# Patient Record
Sex: Male | Born: 1975 | Race: White | Hispanic: No | Marital: Single | Smoking: Former smoker
Health system: Southern US, Community
[De-identification: ages and names within clinical notes are randomized; demographics above are authoritative.]

## PROBLEM LIST (undated history)

## (undated) DIAGNOSIS — F419 Anxiety disorder, unspecified: Secondary | ICD-10-CM

## (undated) DIAGNOSIS — F329 Major depressive disorder, single episode, unspecified: Secondary | ICD-10-CM

## (undated) DIAGNOSIS — K219 Gastro-esophageal reflux disease without esophagitis: Secondary | ICD-10-CM

## (undated) DIAGNOSIS — J310 Chronic rhinitis: Secondary | ICD-10-CM

## (undated) DIAGNOSIS — F32A Depression, unspecified: Secondary | ICD-10-CM

## (undated) DIAGNOSIS — S6290XA Unspecified fracture of unspecified wrist and hand, initial encounter for closed fracture: Secondary | ICD-10-CM

## (undated) DIAGNOSIS — G4700 Insomnia, unspecified: Secondary | ICD-10-CM

## (undated) DIAGNOSIS — R1010 Upper abdominal pain, unspecified: Secondary | ICD-10-CM

## (undated) HISTORY — DX: Chronic rhinitis: J31.0

## (undated) HISTORY — DX: Upper abdominal pain, unspecified: R10.10

## (undated) HISTORY — DX: Anxiety disorder, unspecified: F41.9

## (undated) HISTORY — DX: Depression, unspecified: F32.A

## (undated) HISTORY — DX: Gastro-esophageal reflux disease without esophagitis: K21.9

## (undated) HISTORY — DX: Unspecified fracture of unspecified wrist and hand, initial encounter for closed fracture: S62.90XA

## (undated) HISTORY — DX: Major depressive disorder, single episode, unspecified: F32.9

## (undated) HISTORY — DX: Insomnia, unspecified: G47.00

## (undated) HISTORY — PX: REFRACTIVE SURGERY: SHX103

---

## 2007-04-12 ENCOUNTER — Ambulatory Visit: Payer: Self-pay | Admitting: Internal Medicine

## 2007-04-12 DIAGNOSIS — M6580 Other synovitis and tenosynovitis, unspecified site: Secondary | ICD-10-CM

## 2007-04-17 ENCOUNTER — Encounter (INDEPENDENT_AMBULATORY_CARE_PROVIDER_SITE_OTHER): Payer: Self-pay | Admitting: *Deleted

## 2008-05-25 ENCOUNTER — Telehealth (INDEPENDENT_AMBULATORY_CARE_PROVIDER_SITE_OTHER): Payer: Self-pay | Admitting: *Deleted

## 2008-05-31 DIAGNOSIS — R1010 Upper abdominal pain, unspecified: Secondary | ICD-10-CM

## 2008-05-31 HISTORY — DX: Upper abdominal pain, unspecified: R10.10

## 2008-06-02 ENCOUNTER — Ambulatory Visit: Payer: Self-pay | Admitting: Internal Medicine

## 2008-06-02 DIAGNOSIS — B070 Plantar wart: Secondary | ICD-10-CM

## 2008-06-05 ENCOUNTER — Encounter (INDEPENDENT_AMBULATORY_CARE_PROVIDER_SITE_OTHER): Payer: Self-pay | Admitting: *Deleted

## 2008-06-07 ENCOUNTER — Encounter: Payer: Self-pay | Admitting: Family Medicine

## 2008-06-08 ENCOUNTER — Ambulatory Visit: Payer: Self-pay | Admitting: Family Medicine

## 2008-06-08 DIAGNOSIS — R1013 Epigastric pain: Secondary | ICD-10-CM

## 2008-06-10 ENCOUNTER — Encounter: Payer: Self-pay | Admitting: Internal Medicine

## 2008-06-15 ENCOUNTER — Telehealth (INDEPENDENT_AMBULATORY_CARE_PROVIDER_SITE_OTHER): Payer: Self-pay | Admitting: *Deleted

## 2008-07-13 ENCOUNTER — Ambulatory Visit: Payer: Self-pay | Admitting: Internal Medicine

## 2008-07-13 DIAGNOSIS — K219 Gastro-esophageal reflux disease without esophagitis: Secondary | ICD-10-CM | POA: Insufficient documentation

## 2008-07-13 DIAGNOSIS — K644 Residual hemorrhoidal skin tags: Secondary | ICD-10-CM | POA: Insufficient documentation

## 2008-07-27 ENCOUNTER — Encounter (INDEPENDENT_AMBULATORY_CARE_PROVIDER_SITE_OTHER): Payer: Self-pay | Admitting: *Deleted

## 2008-07-27 ENCOUNTER — Ambulatory Visit: Payer: Self-pay | Admitting: Internal Medicine

## 2008-08-03 ENCOUNTER — Telehealth: Payer: Self-pay | Admitting: Family Medicine

## 2009-01-13 ENCOUNTER — Ambulatory Visit: Payer: Self-pay | Admitting: Internal Medicine

## 2009-01-13 DIAGNOSIS — F341 Dysthymic disorder: Secondary | ICD-10-CM

## 2009-01-13 LAB — CONVERTED CEMR LAB
Protein, U semiquant: NEGATIVE
WBC Urine, dipstick: NEGATIVE

## 2009-02-08 ENCOUNTER — Telehealth (INDEPENDENT_AMBULATORY_CARE_PROVIDER_SITE_OTHER): Payer: Self-pay | Admitting: *Deleted

## 2009-02-08 LAB — CONVERTED CEMR LAB
ALT: 23 units/L (ref 0–53)
Albumin: 4.2 g/dL (ref 3.5–5.2)
Alkaline Phosphatase: 128 units/L — ABNORMAL HIGH (ref 39–117)
BUN: 17 mg/dL (ref 6–23)
Basophils Relative: 0.4 % (ref 0.0–3.0)
Bilirubin, Direct: 0.2 mg/dL (ref 0.0–0.3)
Calcium: 8.9 mg/dL (ref 8.4–10.5)
Chloride: 102 meq/L (ref 96–112)
Creatinine, Ser: 1 mg/dL (ref 0.4–1.5)
Eosinophils Absolute: 0 10*3/uL (ref 0.0–0.7)
Glucose, Bld: 95 mg/dL (ref 70–99)
Hemoglobin: 16.3 g/dL (ref 13.0–17.0)
LDL Cholesterol: 103 mg/dL — ABNORMAL HIGH (ref 0–99)
MCHC: 34.8 g/dL (ref 30.0–36.0)
Monocytes Absolute: 0.5 10*3/uL (ref 0.1–1.0)
Monocytes Relative: 8.3 % (ref 3.0–12.0)
Neutro Abs: 4.2 10*3/uL (ref 1.4–7.7)
Platelets: 185 10*3/uL (ref 150.0–400.0)
Potassium: 4.4 meq/L (ref 3.5–5.1)
RBC: 5.01 M/uL (ref 4.22–5.81)
Total Bilirubin: 1.1 mg/dL (ref 0.3–1.2)
VLDL: 8 mg/dL (ref 0.0–40.0)
WBC: 6.5 10*3/uL (ref 4.5–10.5)

## 2009-02-23 ENCOUNTER — Ambulatory Visit: Payer: Self-pay | Admitting: Internal Medicine

## 2009-03-05 ENCOUNTER — Telehealth (INDEPENDENT_AMBULATORY_CARE_PROVIDER_SITE_OTHER): Payer: Self-pay | Admitting: *Deleted

## 2009-03-05 LAB — CONVERTED CEMR LAB
Alkaline Phosphatase: 142 units/L — ABNORMAL HIGH (ref 39–117)
Bilirubin, Direct: 0.1 mg/dL (ref 0.0–0.3)
GGT: 29 units/L (ref 7–51)
Total Protein: 7.2 g/dL (ref 6.0–8.3)

## 2009-04-15 ENCOUNTER — Encounter: Payer: Self-pay | Admitting: Internal Medicine

## 2009-07-05 ENCOUNTER — Ambulatory Visit: Payer: Self-pay | Admitting: Internal Medicine

## 2010-01-06 ENCOUNTER — Ambulatory Visit: Payer: Self-pay | Admitting: Internal Medicine

## 2010-01-06 DIAGNOSIS — M7660 Achilles tendinitis, unspecified leg: Secondary | ICD-10-CM | POA: Insufficient documentation

## 2010-01-06 LAB — CONVERTED CEMR LAB
Ketones, urine, test strip: NEGATIVE
Urobilinogen, UA: 1
pH: 7.5

## 2010-01-12 ENCOUNTER — Encounter: Admission: RE | Admit: 2010-01-12 | Discharge: 2010-01-12 | Payer: Self-pay | Admitting: Internal Medicine

## 2010-01-13 ENCOUNTER — Telehealth (INDEPENDENT_AMBULATORY_CARE_PROVIDER_SITE_OTHER): Payer: Self-pay | Admitting: *Deleted

## 2010-02-03 ENCOUNTER — Ambulatory Visit: Payer: Self-pay | Admitting: Internal Medicine

## 2010-02-03 ENCOUNTER — Ambulatory Visit: Payer: Self-pay | Admitting: Diagnostic Radiology

## 2010-02-03 ENCOUNTER — Ambulatory Visit (HOSPITAL_BASED_OUTPATIENT_CLINIC_OR_DEPARTMENT_OTHER): Admission: RE | Admit: 2010-02-03 | Discharge: 2010-02-03 | Payer: Self-pay | Admitting: Internal Medicine

## 2010-02-03 DIAGNOSIS — R509 Fever, unspecified: Secondary | ICD-10-CM | POA: Insufficient documentation

## 2010-03-30 ENCOUNTER — Telehealth: Payer: Self-pay | Admitting: Internal Medicine

## 2010-07-04 ENCOUNTER — Emergency Department (HOSPITAL_BASED_OUTPATIENT_CLINIC_OR_DEPARTMENT_OTHER)
Admission: EM | Admit: 2010-07-04 | Discharge: 2010-07-04 | Payer: Self-pay | Source: Home / Self Care | Admitting: Emergency Medicine

## 2010-08-30 NOTE — Progress Notes (Signed)
Summary: U/S results  Phone Note Outgoing Call   Call placed by: [facility] CMA,  January 13, 2010 9:37 AM Details for Reason: advise patient ultrasound essentially negative I recommend to wait a couple of weeks, if he is not better : let me know Summary of Call: left message to call office...............Marland KitchenFelecia Deloach CMA  January 13, 2010 9:37 AM  DISCUSS WITH PATIENT .............Marland KitchenFelecia Deloach CMA  January 13, 2010 11:53 AM

## 2010-08-30 NOTE — Progress Notes (Signed)
Summary: due for chest x-ray   Phone Note Outgoing Call   Summary of Call: he is due for a repeat chest x-ray. Please arrange Jose E. Paz MD  March 30, 2010 8:51 AM   Follow-up for Phone Call        Left message for pt to call back. Army Fossa CMA  March 30, 2010 9:26 AM   Additional Follow-up for Phone Call Additional follow up Details #1::        Pt is aware.Will have done.Army Fossa CMA  March 31, 2010 10:53 AM

## 2010-08-30 NOTE — Assessment & Plan Note (Signed)
Summary: fever/congested/walk inkn   Vital Signs:  Patient profile:   35 year old male Weight:      244.50 pounds Temp:     100.8 degrees F oral Pulse rate:   86 / minute Pulse rhythm:   regular BP sitting:   126 / 82  (left arm) Cuff size:   large  Vitals Entered By: Army Fossa CMA (February 03, 2010 8:29 AM) \\   CC: Fever x 5 days, Chest Congestion Comments -Fever of 101- 102.6 - Seeing blood when coughing -Taking OTC Vitamin C and Mucinex- no relief.    History of Present Illness: persistent fever for 5 days up to 102.6 He also developed cough with green dark sputum and occasionally frank hemoptysis  ROS No nausea vomiting or diarrhea No rash No chest pain Mild headache with fever  Allergies (verified): No Known Drug Allergies  Past History:  Past Medical History: Reviewed history from 01/13/2009 and no changes required. 05-2008  upper abd pain : u/s (-) except for fatty liver changes GERD insomnia, anxiety, depression: f/u at [facility], Rx [place]   Past Surgical History: Reviewed history from 04/12/2007 and no changes required. lasik eye surgery  Social History: Reviewed history from 01/13/2009 and no changes required. retired from the [employer] currently Emergency planning/management officer in [employer] Single no children quit smokeless tobacco 01/21/2008 Alcohol use-no Drug use-no Regular exercise-yes (6-7days/wk for about 1 hour) diet has improved Caffeine - 2-5 cups coffee once daily  Physical Exam  General:  alert and well-developed.  nontoxic Head:  face symmetric Ears:  R ear normal and L ear normal.   Mouth:  no redness or discharge Neck:  no lymphadenopathy  Lungs:  normal respiratory effort, no intercostal retractions, and no accessory muscle use.  slightly decreased breath sounds at right base? Heart:  normal rate, regular rhythm, and no murmur.     Impression & Recommendations:  Problem # 1:  FEVER UNSPECIFIED (ICD-780.60)  fever and respiratory  symptoms, apparently frank hemoptysis per patient's description see instructions Will need antibiotics, will wait for the x-ray cell--[phone]  Orders: T-2 View CXR (71020TC)  Problem # 2:  addendum has pneumonia avelox 400 mg 1 po daily for one week He is going to the beach on vacation , he ineeds to  take it easy; call or see a local M.D. if symptoms get worse  chest x-ray in 6 weeks Maira Christon E. Kalecia Hartney MD  February 03, 2010 11:42 AM   spoke with pt he is aware of results, meds called in. Army Fossa CMA  February 03, 2010 11:47 AM  Complete Medication List: 1)  Protonix 40 Mg Tbec (Pantoprazole sodium) .... Take one tablet daily 2)  Avelox 400 Mg Tabs (Moxifloxacin hcl) .Marland Kitchen.. 1 po daily for one week  Patient Instructions: 1)  rest, fluids, Tylenol or Advil for fever 2)  Mucinex DM over-the-counter twice a day for cough 3)  Chest x-ray 4)  ER if symptoms severe, chest pain or difficulty breathing Prescriptions: AVELOX 400 MG TABS (MOXIFLOXACIN HCL) 1 po daily for one week  #7 x 0   Entered by:   Army Fossa CMA   Authorized by:   Nolon Rod. Damonique Brunelle MD   Signed by:   Army Fossa CMA on 02/03/2010   Method used:   Electronically to        [facility]* (retail)       [address]  [address]       [place], [place]  [address]       Ph: [phone]       Fax: [phone]   RxID:   [id number]

## 2010-08-30 NOTE — Assessment & Plan Note (Signed)
Summary: talk about issues with md//lch   Vital Signs:  Patient profile:   35 year old male Height:      74 inches Weight:      256 pounds BMI:     32.99 Pulse rate:   62 / minute BP sitting:   102 / 70  (left arm)  Vitals Entered By: Jeremy Johann CMA (January 06, 2010 2:10 PM) CC: discuss tendonitis Comments --several other concerns not specific  REVIEWED MED LIST, PATIENT AGREED DOSE AND INSTRUCTION CORRECT    History of Present Illness: 2 weeks history of right testicular discomfort, slightly sore to touch. Denies swelling or a lump No injury Has not taken any medication for this  Continue with left elbow discomfort with some radiation distally to the medial aspect of his forearm. Symptoms are the worse with hyperextension of the elbow  Continue with left Achilles tendon discomfort-numbness  Allergies (verified): No Known Drug Allergies  Past History:  Past Medical History: Reviewed history from 01/13/2009 and no changes required. 05-2008  upper abd pain : u/s (-) except for fatty liver changes GERD insomnia, anxiety, depression: f/u at [facility], Rx [place]   Past Surgical History: Reviewed history from 04/12/2007 and no changes required. lasik eye surgery  Social History: Reviewed history from 01/13/2009 and no changes required. 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 GU:  no dysuria, gross hematuria No frequency or urgency.  Physical Exam  General:  alert and well-developed.   Abdomen:  no inguinal hernias anywhere Rectal:  No external abnormalities noted. Normal sphincter tone. No rectal masses or tenderness. Genitalia:  circumcised, no hydrocele, no varicocele, no scrotal masses, no  testicular masses or atrophy, no cutaneous lesions, and no urethral discharge.  right epididymis slightly more sensitive than the left. Prostate:  Prostate gland firm and smooth, no enlargement, nodularity, tenderness, mass, asymmetry or induration. Extremities:  no lower extremity edema right elbow normal Left elbow normal to inspection and palpation, range of motion full Left Achilles tendon normal to palpation, the insertion in the heel seems to be slightly enlarged but no red or fluctuant    Impression & Recommendations:  Problem # 1:  ? of EPIDIDYMITIS, RIGHT (ICD-604.90) right testicular discomfort, right epididymis slightly more sensitive? Plan:  UA-- essentially neg  Ultrasound  Problem # 2:  TENDINITIS, ELBOW (ICD-727.09) to ortho   Orders: Orthopedic Referral (Ortho)  Problem # 3:  ACHILLES TENDINITIS (ICD-726.71)  to ortho  Orders: Orthopedic Referral (Ortho)  Complete Medication List: 1)  Protonix 40 Mg Tbec (Pantoprazole sodium) .... Take one tablet daily  Other Orders: UA Dipstick w/o Micro (manual) ([address]) Radiology Referral (Radiology)  Laboratory Results   Urine Tests    Routine Urinalysis   Color: yellow Appearance: Clear Glucose: negative   (Normal Range: Negative) Bilirubin: negative   (Normal Range: Negative) Ketone: negative   (Normal Range: Negative) Spec. Gravity: 1.015   (Normal Range: 1.003-1.035) Blood: negative   (Normal Range: Negative) pH: 7.5   (Normal Range: 5.0-8.0) Protein: trace   (Normal Range: Negative) Urobilinogen: 1.0   (Normal Range: 0-1) Nitrite: negative   (Normal Range: Negative) Leukocyte Esterace: negative   (Normal Range: Negative)

## 2010-10-11 LAB — COMPREHENSIVE METABOLIC PANEL
ALT: 31 U/L (ref 0–53)
AST: 26 U/L (ref 0–37)
Albumin: 4.6 g/dL (ref 3.5–5.2)
BUN: 23 mg/dL (ref 6–23)
CO2: 24 mEq/L (ref 19–32)
GFR calc Af Amer: >60 mL/min (ref >60)
Potassium: 4 mEq/L (ref 3.5–5.1)
Total Bilirubin: 1.3 mg/dL — ABNORMAL HIGH (ref 0.3–1.2)
Total Protein: 7.5 g/dL (ref 6.0–8.3)

## 2010-10-11 LAB — LIPASE, BLOOD: Lipase: 42 U/L (ref 23–300)

## 2010-10-11 LAB — DIFFERENTIAL
Basophils Absolute: 0 10*3/uL (ref 0.0–0.1)
Eosinophils Absolute: 0 10*3/uL (ref 0.0–0.7)
Eosinophils Relative: 1 % (ref 0–5)
Lymphs Abs: 0.4 10*3/uL — ABNORMAL LOW (ref 0.7–4.0)
Neutro Abs: 6.3 10*3/uL (ref 1.7–7.7)
Neutrophils Relative %: 84 % — ABNORMAL HIGH (ref 43–77)

## 2010-10-11 LAB — CBC
Hemoglobin: 17.4 g/dL — ABNORMAL HIGH (ref 13.0–17.0)
MCHC: 34.9 g/dL (ref 30.0–36.0)
Platelets: 201 10*3/uL (ref 150–400)
RDW: 12.8 % (ref 11.5–15.5)

## 2011-02-16 ENCOUNTER — Encounter: Payer: Self-pay | Admitting: Internal Medicine

## 2011-02-17 ENCOUNTER — Ambulatory Visit (INDEPENDENT_AMBULATORY_CARE_PROVIDER_SITE_OTHER): Payer: Managed Care, Other (non HMO) | Admitting: Internal Medicine

## 2011-02-17 ENCOUNTER — Encounter: Payer: Self-pay | Admitting: Internal Medicine

## 2011-02-17 DIAGNOSIS — M7660 Achilles tendinitis, unspecified leg: Secondary | ICD-10-CM

## 2011-02-17 DIAGNOSIS — R945 Abnormal results of liver function studies: Secondary | ICD-10-CM

## 2011-02-17 DIAGNOSIS — R7989 Other specified abnormal findings of blood chemistry: Secondary | ICD-10-CM

## 2011-02-17 DIAGNOSIS — M6580 Other synovitis and tenosynovitis, unspecified site: Secondary | ICD-10-CM

## 2011-02-17 NOTE — Assessment & Plan Note (Addendum)
Ongoing issue, refer to Dr. Pearletha Forge Local injection? Physical therapy?

## 2011-02-17 NOTE — Assessment & Plan Note (Signed)
Ongoing issue, refer to sports medicine

## 2011-02-17 NOTE — Assessment & Plan Note (Signed)
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 Recent  AP consistent with history of elevated levels. Recommend observation. Patient reassured

## 2011-02-17 NOTE — Progress Notes (Signed)
  Subjective:    Patient ID: Samuel [name], male    DOB: [birth year], 35 y.o.   MRN: [id number]  HPI Likes to go over her recent labs Also concerned about the fact that he gains weight very easy, if he's not under a strict diet . Extensive review of data from 11-2010: CBC, BMP, TSH normal Total cholesterol 195, HDL 67, LDL 99.  Past Medical History  Diagnosis Date  . Upper abdominal pain 05/2008    u/s (-) except for fatty liver changes   . GERD (gastroesophageal reflux disease)   . Insomnia     f/u at [facility], Rx [place]  . Anxiety   . Depression    Past Surgical History  Procedure Date  . Refractive surgery     Review of Systems Complains of ongoing left heel and left elbow pain    Objective:   Physical Exam Alert, oriented, in no apparent distress Elbows symmetric and essentially normal to palpation       Assessment & Plan:  Patient's counselor for more than 15 minutes. I don't see any obvious reason for him " gain weight easily", likely genetic; continued to be active and eat healthy

## 2011-02-22 ENCOUNTER — Ambulatory Visit: Payer: Managed Care, Other (non HMO) | Admitting: Family Medicine

## 2011-02-27 ENCOUNTER — Ambulatory Visit (INDEPENDENT_AMBULATORY_CARE_PROVIDER_SITE_OTHER): Payer: Managed Care, Other (non HMO) | Admitting: Family Medicine

## 2011-02-27 ENCOUNTER — Encounter: Payer: Self-pay | Admitting: Family Medicine

## 2011-02-27 DIAGNOSIS — M7660 Achilles tendinitis, unspecified leg: Secondary | ICD-10-CM

## 2011-02-27 DIAGNOSIS — M6580 Other synovitis and tenosynovitis, unspecified site: Secondary | ICD-10-CM

## 2011-02-27 NOTE — Progress Notes (Signed)
Subjective:    Patient ID: Samuel [name], male    DOB: [birth year], 35 y.o.   MRN: [id number] PCP: Dr. Drue Novel  HPI 35 yo M here for left achilles pain, left elbow pain  [name]. At the time it started he was running 3-5 days a week. Developed pain after a run at one time, took a week off - went back to running and had burning in left achilles at insertion. Has dealt with this issue ever since then. Saw an orthopedist and was advised to stretch the area which he has [name]. No limping and ok with regular activities - gets worse after running. Has not run in 2 months now [employer], icing as well. Has not tried any inserts or braces.  2. Left elbow pain Patient lifts weights 4 times a week. Reports over past several months has developed what feels like a knot on outside aspect of posterior elbow. Gets pinching in this location with full extension. No acute injury. He is ambidextrous. Has not tried PT.  Past Medical History  Diagnosis Date  . Upper abdominal pain 05/2008    u/s (-) except for fatty liver changes   . GERD (gastroesophageal reflux disease)   . Insomnia     f/u at [facility], Rx [place]  . Anxiety   . Depression     Current Outpatient Prescriptions on File Prior to Visit  Medication Sig Dispense Refill  . pantoprazole (PROTONIX) 40 MG tablet Take 40 mg by mouth daily.        Marland Kitchen zolpidem (AMBIEN) 5 MG tablet Take 2.5 mg by mouth at bedtime as needed.          Past Surgical History  Procedure Date  . Refractive surgery     No Known Allergies  History   Social History  . Marital Status: Single    Spouse Name: N/A    Number of Children: 0  . Years of Education: N/A   Occupational History  . Retired from [employer]   . Emergency planning/management officer in [employer] [place]    Social History Main Topics  . Smoking status: Former Games developer  . Smokeless tobacco: Former Neurosurgeon    Quit date:  02/27/2008  . Alcohol Use: No  . Drug Use: No  . Sexually Active: Not on file   Other Topics Concern  . Not on file   Social History Narrative   Regular exercise- yes (6-7 days/wk for about 1 hour)Diet- has improved Caffeine- 2-5 cups coffee once daily    Family History  Problem Relation Age of Onset  . Diabetes      GM  . Heart attack      GF  . Prostate cancer Neg Hx   . Colon cancer Neg Hx   . Sudden death Neg Hx   . Hyperlipidemia Neg Hx   . Colon polyps Father     in his mid 45s   . Diabetes Maternal Grandmother   . Hypertension Maternal Grandmother   . Heart attack Maternal Grandfather     BP 124/77  Pulse 65  Temp(Src) 98 F (36.7 C) (Oral)  Ht 6' (1.829 m)  Wt 255 lb 6.4 oz (115.849 kg)  BMI 34.64 kg/m2  Review of Systems See HPI above.    Objective:   Physical Exam Gen: NAD  L foot/ankle: haglunds deformity left heel.  No ecchymoses, other deformity. FROM with  pain achilles insertion with full passive dorsiflexion. TTP insertion.  No TTP within Achilles body or calf.  No other TTP about foot/ankle. Negative ant drawer and talar tilt.   Negative syndesmotic compression. Thompsons test negative. Negative syndesmotic compression. NV intact distally.  No leg length inequality.  L elbow: No gross deformity, swelling, ecchymoses. FROM but pain at full extension. TTP lateral triceps tendon at insertion reproducing his pain. Collateral ligaments intact. Strength 5/5 with elbow flexion and extension. Negative tinels at elbow.  R elbow: FROM without pain, swelling, weakness, instability.    Assessment & Plan:  1. Left achilles tendinopathy - shown home exercises to start at least once daily.  Icing, avoid uneven ground.  Heel lifts.  Consider comforthotics with heel lifts, physical therapy, nitro patches if not improving.  2. Left triceps tendinopathy - start PT to learn home exercises, stretches.  Pinching lateral aspect of triceps insertion may  be related to spur pinching at this location.  Will try to treat conservatively.  If not improving, consider x-rays, possibility of MRI/referral for debridement of this area that's causing mechanical symptoms.

## 2011-02-27 NOTE — Patient Instructions (Signed)
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 Ice the area 15 minutes at a time 3-4 times a day. Lowering/raise on a step exercise 3 x 15 once or twice a day - if too difficult, can start on the edge of a book 3 sets of 6 and advance. Avoid uneven ground, hills as much as possible. Heel lifts help unload the area of pain from stretching out fully during the day. Over-the-counter orthotics may be helpful (we can make custom ones here as well). Formal physical therapy will show you additional exercises to help improve this - make sure you do these every day. Ok to run as long as not limping and pain is less than a 3 on a scale of 1-10 - otherwise cross train with biking, swimming.  The area of pain in your left elbow is the distal triceps tendon. Physical therapy is the mainstay of treatment for this as well - do these every day. You may have to back down on heavy weights while rehabbing this. Ice the area 15 minutes at a time 3-4 times a day. If not improving after 6 weeks we can consider nitro patches to both of these areas (shown to help with chronic tendinopathies). Surgery is an option but we will try to avoid this if at all possible.

## 2011-02-28 ENCOUNTER — Encounter: Payer: Self-pay | Admitting: Family Medicine

## 2011-02-28 NOTE — Assessment & Plan Note (Signed)
Left achilles tendinopathy - shown home exercises to start at least once daily.  Icing, avoid uneven ground.  Heel lifts.  Consider comforthotics with heel lifts, physical therapy, nitro patches if not improving.

## 2011-02-28 NOTE — Assessment & Plan Note (Signed)
Left triceps tendinopathy - start PT to learn home exercises, stretches.  Pinching lateral aspect of triceps insertion may be related to spur pinching at this location.  Will try to treat conservatively.  If not improving, consider x-rays, possibility of MRI/referral for debridement of this area that's causing mechanical symptoms.

## 2011-03-06 ENCOUNTER — Ambulatory Visit: Payer: Managed Care, Other (non HMO) | Attending: Family Medicine | Admitting: Physical Therapy

## 2011-03-15 ENCOUNTER — Emergency Department (HOSPITAL_BASED_OUTPATIENT_CLINIC_OR_DEPARTMENT_OTHER)
Admission: EM | Admit: 2011-03-15 | Discharge: 2011-03-15 | Disposition: A | Discharge status: Home / Self Care | Payer: Managed Care, Other (non HMO) | Attending: Emergency Medicine | Admitting: Emergency Medicine

## 2011-03-15 ENCOUNTER — Encounter (HOSPITAL_BASED_OUTPATIENT_CLINIC_OR_DEPARTMENT_OTHER): Payer: Self-pay | Admitting: *Deleted

## 2011-03-15 DIAGNOSIS — S01501A Unspecified open wound of lip, initial encounter: Secondary | ICD-10-CM | POA: Insufficient documentation

## 2011-03-15 DIAGNOSIS — S0181XA Laceration without foreign body of other part of head, initial encounter: Secondary | ICD-10-CM

## 2011-03-15 DIAGNOSIS — K219 Gastro-esophageal reflux disease without esophagitis: Secondary | ICD-10-CM | POA: Insufficient documentation

## 2011-03-15 MED ORDER — TETANUS-DIPHTH-ACELL PERTUSSIS 5-2.5-18.5 LF-MCG/0.5 IM SUSP
0.5000 mL | Freq: Once | INTRAMUSCULAR | Status: AC
  Administered 2011-03-15: 0.5 mL via INTRAMUSCULAR
  Filled 2011-03-15: qty 0.5

## 2011-03-15 MED ORDER — LIDOCAINE-EPINEPHRINE 2 %-1:100000 IJ SOLN
INTRAMUSCULAR | Status: AC
  Administered 2011-03-15: 20 mL
  Filled 2011-03-15: qty 1

## 2011-03-15 MED ORDER — LIDOCAINE-EPINEPHRINE 2 %-1:100000 IJ SOLN
20.0000 mL | Freq: Once | INTRAMUSCULAR | Status: AC
  Administered 2011-03-15: 20 mL

## 2011-03-15 NOTE — ED Notes (Signed)
Pt removed his own retainer and was given denture cup to place it in.
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
## 2011-05-17 ENCOUNTER — Ambulatory Visit: Payer: Managed Care, Other (non HMO) | Admitting: Family Medicine

## 2011-05-17 ENCOUNTER — Ambulatory Visit (INDEPENDENT_AMBULATORY_CARE_PROVIDER_SITE_OTHER): Payer: Managed Care, Other (non HMO) | Admitting: Internal Medicine

## 2011-05-17 ENCOUNTER — Encounter: Payer: Self-pay | Admitting: Internal Medicine

## 2011-05-17 VITALS — BP 118/70 | HR 75 | Temp 97.9°F | Resp 16 | Wt 252.0 lb

## 2011-05-17 DIAGNOSIS — J310 Chronic rhinitis: Secondary | ICD-10-CM

## 2011-05-17 HISTORY — DX: Chronic rhinitis: J31.0

## 2011-05-17 MED ORDER — AMOXICILLIN 500 MG PO CAPS: 1000.0000 mg | ORAL_CAPSULE | Freq: Two times a day (BID) | ORAL | Status: AC

## 2011-05-17 MED ORDER — AZELASTINE-FLUTICASONE 137-50 MCG/ACT NA SUSP: 2.0000 | Freq: Every day | NASAL | Status: DC

## 2011-05-17 NOTE — Progress Notes (Signed)
  Subjective:    Patient ID: Samuel [name], male    DOB: [birth year], 35 y.o.   MRN: [id number]  HPI Nasal congestion for several months without much nasal discharge. 2 months ago he started to take over-the-counter Afrin daily, initially helped but now is not. He also takes Claritin-D over-the-counter.  Past Medical History  Diagnosis Date  . Upper abdominal pain 05/2008    u/s (-) except for fatty liver changes   . GERD (gastroesophageal reflux disease)   . Insomnia     f/u at [facility], Rx [place]  . Anxiety   . Depression    Past Surgical History  Procedure Date  . Refractive surgery      Review of Systems Very mild nasal discharge from time to time, no sore throat, some postnasal dripping. No cough No itchy eyes but slightly itchy nose and sneezing from time to time      Objective:   Physical Exam  Constitutional: He appears well-developed and well-nourished.  HENT:  Head: Normocephalic and atraumatic.  Right Ear: External ear normal.  Left Ear: External ear normal.       Face is symmetric, slightly tender and both frontal sinuses, not tender at the maxillary sinuses. Nose definitely congested, turbinates erythematous and slt swollen L>R w/o obvious polyps. No discharge noted  Cardiovascular: Normal rate, regular rhythm and normal heart sounds.   No murmur heard. Pulmonary/Chest: Effort normal and breath sounds normal. No respiratory distress. He has no wheezes. He has no rales.          Assessment & Plan:

## 2011-05-17 NOTE — Assessment & Plan Note (Addendum)
Symptoms consistent with rhinitis, likely allergic complicated by rebound from Afrin. Also possible infection in light of chronic congestion and tenderness at the frontal sinuses. Plan: Discontinue Afrin Nasal steroids and antihistaminics Amoxicillin Discontinue the oral decongestants. See instructions Patient will call if not better in a few weeks, ENT referral?

## 2011-05-17 NOTE — Patient Instructions (Signed)
claritin without the "D" once a day Call if no better in 1 month

## 2011-12-01 ENCOUNTER — Telehealth: Payer: Self-pay | Admitting: Internal Medicine

## 2011-12-01 MED ORDER — SCOPOLAMINE 1 MG/3DAYS TD PT72: 1.0000 | MEDICATED_PATCH | TRANSDERMAL | Status: DC

## 2011-12-01 NOTE — Telephone Encounter (Signed)
Discussed with pt

## 2011-12-01 NOTE — Telephone Encounter (Signed)
Please advise 

## 2011-12-01 NOTE — Telephone Encounter (Signed)
Prescription sent to the pharmacy, strongly encourage him to try them out before he leaves town to be sure he doesn't have side effects from it

## 2011-12-01 NOTE — Telephone Encounter (Signed)
Patient states he is going out of town with his father for some deep sea excursions and would like to get some patches called into [facility] for motion sickness. Can call patient at [phone]

## 2012-04-05 ENCOUNTER — Telehealth: Payer: Self-pay | Admitting: *Deleted

## 2012-04-05 NOTE — Telephone Encounter (Signed)
Left message to call office

## 2012-04-05 NOTE — Telephone Encounter (Signed)
[facility] Triage Call Report Triage Record Num: [id number] Operator: Cornell Barman Patient Name: Samuel [name] Call Date & Time: 04/04/2012 6:01:44PM Patient Phone: [phone] PCP: Nolon Rod. Paz Patient Gender: Male PCP Fax : Patient DOB: 1976-05-05 Practice Name: [facility] Reason for Call: Caller: Malcom/Patient; PCP: Willow Ora; CB#: [phone]; Call regarding Chigger bites; Onset 03/29/12. While walking [place]. He states they are both legs from mid thigh down. He estimates approx 40-50 bites. no swelling Afebrile, intense itching. He has treated with OTC after bite medications. i.e. chigger X, Chig-guard, Hydrogen Peroxide. Emergent s/sx ruled out per Bites/stings -Insect and spider Protocol with exception to Itchy bites. Caller requesting medication. He is a Emergency planning/management officer must wear socks/boots. He uses [facility] in [place] . Home care reviewed advised Benadryl 50mg  po every 6 hours while not working. Understanding expressed. Advised i would forward MEDICATION REQUEST TO THE OFFICE FOR BITES AND ITCHING. Encouraged to call back for questions, changes or concerns. Protocol(s) Used: Bites and Stings - Insects or Spiders Recommended Outcome per Protocol: Provide Home/Self Care Reason for Outcome: Itchy bites Care Advice: Topical corticosteroid cream/gel may be used as directed on label or by pharmacist. DO NOT apply a dressing to the area. ~ Apply cloth-covered ice pack or a cool compress to the area for no more than 20 minutes 4-8 times a day while awake to reduce pain and swelling. ~ ~ SYMPTOM / CONDITION MANAGEMENT ITCHING RELIEF: - Avoid scratching; may cause further irritation and secondary infection - Take cool showers or baths to relieve itching - If cool water alone does not relieve itching, try adding 1/2 to 1 cup baking soda or colloidal oatmeal (Aveeno) to bath water - Follow with application of a bland lotion such as  calamine (do not apply to the eyes or genitals)

## 2012-04-05 NOTE — Telephone Encounter (Signed)
Discuss with patient  

## 2012-04-05 NOTE — Telephone Encounter (Signed)
To use hydrocortisone cream 1% OTC, benadryl  as needed; office visit if not improving

## 2012-07-02 ENCOUNTER — Encounter: Payer: Self-pay | Admitting: Internal Medicine

## 2012-07-02 ENCOUNTER — Ambulatory Visit (INDEPENDENT_AMBULATORY_CARE_PROVIDER_SITE_OTHER): Payer: Managed Care, Other (non HMO) | Admitting: Internal Medicine

## 2012-07-02 VITALS — BP 136/86 | HR 63 | Temp 98.1°F | Wt 276.0 lb

## 2012-07-02 DIAGNOSIS — Z0000 Encounter for general adult medical examination without abnormal findings: Secondary | ICD-10-CM | POA: Insufficient documentation

## 2012-07-02 DIAGNOSIS — J310 Chronic rhinitis: Secondary | ICD-10-CM

## 2012-07-02 MED ORDER — AMOXICILLIN 500 MG PO CAPS: 1000.0000 mg | ORAL_CAPSULE | Freq: Two times a day (BID) | ORAL | Status: DC

## 2012-07-02 MED ORDER — FLUTICASONE PROPIONATE 50 MCG/ACT NA SUSP: 2.0000 | Freq: Every day | NASAL | Status: DC

## 2012-07-02 MED ORDER — LORATADINE 10 MG PO TABS: 10.0000 mg | ORAL_TABLET | Freq: Every day | ORAL | Status: DC

## 2012-07-02 NOTE — Assessment & Plan Note (Signed)
Acute on chronic nasal congestion, likely allergis with a URI Used to take dynmista has not used it in a while. Plan: Flonase for several weeks, Claritin daily. Antibiotics for the acute infex If not improving in the next few weeks, patient will call for ENT referral, nasal polyps?

## 2012-07-02 NOTE — Patient Instructions (Addendum)
Rest, fluids , tylenol For cough, take Mucinex DM twice a day as needed  Take the antibiotic, amoxicillin, for one week. Call if no better in few days Call anytime if the symptoms are severe ---- Start using Flonase every day once a day, also Claritin 10 mg once a day. If the chronic blockage of the nose is not better, call me in few weeks for ENT referral

## 2012-07-02 NOTE — Progress Notes (Signed)
  Subjective:    Patient ID: Samuel [name], male    DOB: 11-Sep-1975, 36 y.o.   MRN: [id number]  HPI Developed nose congestion, nasal discharge which is a slightly colored and even some blood in the nasal discharge last weekend. In the last 48 hours has noted cough, chest congestion and some sputum production. Also, for the last few months his nose feels stuffy even if he doesn't have any acute symptoms.   Past Medical History  Diagnosis Date  . Upper abdominal pain 05/2008    u/s (-) except for fatty liver changes   . GERD (gastroesophageal reflux disease)   . Insomnia     f/u at [facility], Rx [place]  . Anxiety and depression   . Hand fracture     Left 2013   . Chronic rhinitis 05/17/2011   Past Surgical History  Procedure Date  . Refractive surgery     Review of Systems No fever chills No nausea, vomiting, diarrhea or myalgias Denies any sneezing, itchy eyes or itchy nose    Objective:   Physical Exam  General -- alert, well-developed Neck --no LADs HEENT -- TMs normal, throat w/o redness, face symmetric and not tender to palpation,  nose with edematous turbinates, mild amount of discharge noted Lungs -- normal respiratory effort, no intercostal retractions, no accessory muscle use, and normal breath sounds.   Heart-- normal rate, regular rhythm, no murmur, and no gallop.    Neurologic-- alert & oriented X3 and strength normal in all extremities. Psych-- Cognition and judgment appear intact. Alert and cooperative with normal attention span and concentration.  not anxious appearing and not depressed appearing.       Assessment & Plan:

## 2012-07-02 NOTE — Assessment & Plan Note (Signed)
Gets physicals at the [facility]

## 2012-07-22 ENCOUNTER — Telehealth: Payer: Self-pay | Admitting: Internal Medicine

## 2012-07-22 NOTE — Telephone Encounter (Signed)
Patient is no better, is requesting new Rx. Please contact patient.

## 2012-07-22 NOTE — Telephone Encounter (Signed)
lmovm for pt to return call.  

## 2012-12-09 ENCOUNTER — Ambulatory Visit (INDEPENDENT_AMBULATORY_CARE_PROVIDER_SITE_OTHER): Payer: Managed Care, Other (non HMO) | Admitting: Family Medicine

## 2012-12-09 ENCOUNTER — Encounter: Payer: Self-pay | Admitting: Family Medicine

## 2012-12-09 VITALS — BP 120/90 | HR 73 | Temp 98.5°F | Wt 267.2 lb

## 2012-12-09 DIAGNOSIS — J019 Acute sinusitis, unspecified: Secondary | ICD-10-CM

## 2012-12-09 DIAGNOSIS — J329 Chronic sinusitis, unspecified: Secondary | ICD-10-CM

## 2012-12-09 MED ORDER — MOMETASONE FUROATE 50 MCG/ACT NA SUSP: 2.0000 | Freq: Every day | NASAL | Status: DC

## 2012-12-09 MED ORDER — CEFUROXIME AXETIL 500 MG PO TABS: 500.0000 mg | ORAL_TABLET | Freq: Two times a day (BID) | ORAL | Status: DC

## 2012-12-09 NOTE — Progress Notes (Signed)
  Subjective:     Samuel [name] is a 37 y.o. male who presents for evaluation of sinus pain. Symptoms include: congestion, cough, facial pain, nasal congestion, post nasal drip, purulent rhinorrhea and sinus pressure. Onset of symptoms was 1 week ago. Symptoms have been gradually worsening since that time. Past history is significant for no history of pneumonia or bronchitis. Patient is a non-smoker.  The following portions of the patient's history were reviewed and updated as appropriate: allergies, current medications, past family history, past medical history, past social history, past surgical history and problem list.  Review of Systems Pertinent items are noted in HPI.   Objective:    BP 120/90  Pulse 73  Temp(Src) 98.5 F (36.9 C) (Oral)  Wt 267 lb 3.2 oz (121.201 kg)  BMI 36.23 kg/m2  SpO2 95% General appearance: alert, cooperative, appears stated age and no distress Ears: normal TM's and external ear canals both ears Nose: green discharge, moderate congestion, turbinates red, swollen, sinus tenderness bilateral Throat: lips, mucosa, and tongue normal; teeth and gums normal Neck: mild anterior cervical adenopathy, no adenopathy, no carotid bruit, no JVD, supple, symmetrical, trachea midline and thyroid not enlarged, symmetric, no tenderness/mass/nodules Lungs: clear to auscultation bilaterally Heart: S1, S2 normal    Assessment:    Acute bacterial sinusitis.    Plan:    Neti pot recommended. Instructions given. Nasal steroids per medication orders. Antihistamines per medication orders. Ceftin per medication orders. f/u pcp prn

## 2012-12-09 NOTE — Patient Instructions (Signed)

## 2012-12-09 NOTE — Addendum Note (Signed)
Addended by: Arnette Norris on: 12/09/2012 04:43 PM   Modules accepted: Orders

## 2012-12-09 NOTE — Addendum Note (Signed)
Addended by: Lelon Perla on: 12/09/2012 04:17 PM   Modules accepted: Level of Service

## 2012-12-11 ENCOUNTER — Telehealth: Payer: Self-pay | Admitting: Internal Medicine

## 2012-12-11 ENCOUNTER — Ambulatory Visit (INDEPENDENT_AMBULATORY_CARE_PROVIDER_SITE_OTHER): Payer: Managed Care, Other (non HMO) | Admitting: Internal Medicine

## 2012-12-11 ENCOUNTER — Encounter: Payer: Self-pay | Admitting: *Deleted

## 2012-12-11 VITALS — BP 122/84 | HR 73 | Temp 98.2°F | Wt 266.0 lb

## 2012-12-11 DIAGNOSIS — J019 Acute sinusitis, unspecified: Secondary | ICD-10-CM

## 2012-12-11 MED ORDER — CEFUROXIME AXETIL 500 MG PO TABS: 500.0000 mg | ORAL_TABLET | Freq: Two times a day (BID) | ORAL | Status: DC

## 2012-12-11 MED ORDER — PREDNISONE 10 MG PO TABS: ORAL_TABLET | ORAL | Status: DC

## 2012-12-11 NOTE — Progress Notes (Signed)
  Subjective:    Patient ID: Samuel [name], male    DOB: [birth year], 37 y.o.   MRN: [id number]  HPI Acute visit Symptoms started 9 days ago, was seen here 2 days ago, diagnosed with sinusitis and prescribed Ceftin. Continue pain and they nose and  maxillary sinuses, worse on the left. Pain is sometimes intense and he has been unable to sleep well.  Past Medical History  Diagnosis Date  . Upper abdominal pain 05/2008    u/s (-) except for fatty liver changes   . GERD (gastroesophageal reflux disease)   . Insomnia     f/u at [facility], Rx [place]  . Anxiety and depression   . Hand fracture     Left 2013   . Chronic rhinitis 05/17/2011   Past Surgical History  Procedure Laterality Date  . Refractive surgery      Review of Systems Had fever and chills last weekend, currently without fever. Mild dizziness. He had green nasal discharge, now discharge is clear but he continued to be quite stuffy. No chest congestion, mild cough which is dry.     Objective:   Physical Exam BP 122/84  Pulse 73  Temp(Src) 98.2 F (36.8 C) (Oral)  Wt 266 lb (120.657 kg)  BMI 36.07 kg/m2  SpO2 95%  General -- alert, well-developed, NAD .   Neck --  no LADs HEENT -- TMs normal, throat w/o redness, face symmetric and tender to palpation L>R max sinus area; Nose is quite congested, turbinates swollen L>R, green d/c noted EOMI, PERRLA  Lungs -- normal respiratory effort, no intercostal retractions, no accessory muscle use, and normal breath sounds.   Heart-- normal rate, regular rhythm, no murmur, and no gallop.    Neurologic-- alert & oriented X3 and strength normal in all extremities. Psych-- Cognition and judgment appear intact. Alert and cooperative with normal attention span and concentration.  not anxious appearing and not depressed appearing.       Assessment & Plan:  Acute sinusitis. Started  Ceftin 2 days ago, not feeling better. Plan:  Currently on Nasonex, switch to dymista Ceftin for  a total of 10 days prednisone as prescribed Call if not better, change abx? CT? Work excuse

## 2012-12-11 NOTE — Patient Instructions (Addendum)
Rest, fluids , tylenol Take Mucinex twice a day x 1 week  Dymista nasal spray twice a day   Prednisone  Take the antibiotic as prescribed  (ceftin) for a total of 10 days, i sent additional tablets to your pharmacy Call if no better in few days Call anytime if the symptoms are severe

## 2012-12-11 NOTE — Telephone Encounter (Signed)
Patient Information:  Caller Name: Jameel  Phone: [phone]  Patient: Samuel [name], Samuel [name]  Gender: Male  DOB: September 23, 1975  Age: 37 Years  PCP: Willow Ora  Office Follow Up:  Does the office need to follow up with this patient?: No  Instructions For The Office: N/A   Symptoms  Reason For Call & Symptoms: Pt states he was seen on Momday 12/09/12 and diagnosed Sinus infection.  Not improved, pt states he has an appt this afternoon 12/11/12 15  .  Reviewed Health History In EMR: Yes  Reviewed Medications In EMR: Yes  Reviewed Allergies In EMR: Yes  Reviewed Surgeries / Procedures: Yes  Date of Onset of Symptoms: 12/09/2012  Guideline(s) Used:  Sinus Pain and Congestion  Disposition Per Guideline:   See Today or Tomorrow in Office  Reason For Disposition Reached:   Using nasal washes and pain medicine > 24 hours and sinus pain (lower forehead, cheekbone, or eye) persists  Advice Given:  Reassurance:   Sinus congestion is a normal part of a cold.  Usually home treatment with nasal washes can prevent an actual bacterial sinus infection.  Antibiotics are not helpful for the sinus congestion that occurs with colds.  Here is some care advice that should help.  For a Runny Nose With Profuse Discharge:  Nasal mucus and discharge helps to wash viruses and bacteria out of the nose and sinuses.  Blowing the nose is all that is needed.  If the skin around your nostrils gets irritated, apply a tiny amount of petroleum ointment to the nasal openings once or twice a day.  For a Stuffy Nose - Use Nasal Washes:  Introduction: Saline (salt water) nasal irrigation (nasal wash) is an effective and simple home remedy for treating stuffy nose and sinus congestion. The nose can be irrigated by pouring, spraying, or squirting salt water into the nose and then letting it run back out.  How it Helps: The salt water rinses out excess mucus, washes out any irritants (dust, allergens) that might be present,  and moistens the nasal cavity.  Pain and Fever Medicines:  For pain or fever relief, take either acetaminophen or ibuprofen.  They are over-the-counter (OTC) drugs that help treat both fever and pain. You can buy them at the drugstore.  Ibuprofen (e.g., Motrin, Advil):  Take 400 mg (two 200 mg pills) by mouth every 6 hours.  Another choice is to take 600 mg (three 200 mg pills) by mouth every 8 hours.  The most you should take each day is 1,200 mg (six 200 mg pills), unless your doctor has told you to take more.  Hydration:  Drink plenty of liquids (6-8 glasses of water daily). If the air in your home is dry, use a cool mist humidifier  Expected Course:  Sinus congestion from viral upper respiratory infections (colds) usually lasts 5-10 days.  Occasionally a cold can worsen and turn into bacterial sinusitis. Clues to this are sinus symptoms lasting longer than 10 days, fever lasting longer than 3 days, and worsening pain. Bacterial sinusitis may need antibiotic treatment.  Call Back If:   Sinus congestion (fullness) lasts longer than 10 days  Fever lasts longer than 3 days  You become worse.  Patient Will Follow Care Advice:  YES  Appointment Scheduled:  12/11/2012 15:00:00 Appointment Scheduled Provider:  Willow Ora

## 2012-12-12 ENCOUNTER — Encounter: Payer: Self-pay | Admitting: Internal Medicine

## 2013-01-14 ENCOUNTER — Encounter (HOSPITAL_BASED_OUTPATIENT_CLINIC_OR_DEPARTMENT_OTHER): Payer: Self-pay | Admitting: *Deleted

## 2013-01-14 ENCOUNTER — Emergency Department (HOSPITAL_BASED_OUTPATIENT_CLINIC_OR_DEPARTMENT_OTHER)
Admission: EM | Admit: 2013-01-14 | Discharge: 2013-01-14 | Disposition: A | Discharge status: Home / Self Care | Payer: Managed Care, Other (non HMO) | Attending: Emergency Medicine | Admitting: Emergency Medicine

## 2013-01-14 ENCOUNTER — Emergency Department (HOSPITAL_BASED_OUTPATIENT_CLINIC_OR_DEPARTMENT_OTHER): Payer: Managed Care, Other (non HMO)

## 2013-01-14 DIAGNOSIS — Z8781 Personal history of (healed) traumatic fracture: Secondary | ICD-10-CM | POA: Insufficient documentation

## 2013-01-14 DIAGNOSIS — N23 Unspecified renal colic: Secondary | ICD-10-CM | POA: Insufficient documentation

## 2013-01-14 DIAGNOSIS — Z8659 Personal history of other mental and behavioral disorders: Secondary | ICD-10-CM | POA: Insufficient documentation

## 2013-01-14 DIAGNOSIS — Z8709 Personal history of other diseases of the respiratory system: Secondary | ICD-10-CM | POA: Insufficient documentation

## 2013-01-14 DIAGNOSIS — K219 Gastro-esophageal reflux disease without esophagitis: Secondary | ICD-10-CM | POA: Insufficient documentation

## 2013-01-14 DIAGNOSIS — Z87891 Personal history of nicotine dependence: Secondary | ICD-10-CM | POA: Insufficient documentation

## 2013-01-14 DIAGNOSIS — Z79899 Other long term (current) drug therapy: Secondary | ICD-10-CM | POA: Insufficient documentation

## 2013-01-14 DIAGNOSIS — G4700 Insomnia, unspecified: Secondary | ICD-10-CM | POA: Insufficient documentation

## 2013-01-14 DIAGNOSIS — R112 Nausea with vomiting, unspecified: Secondary | ICD-10-CM | POA: Insufficient documentation

## 2013-01-14 LAB — CBC WITH DIFFERENTIAL/PLATELET
Basophils Relative: 0 % (ref 0–1)
Eosinophils Relative: 2 % (ref 0–5)
HCT: 44.7 % (ref 39.0–52.0)
Hemoglobin: 16.2 g/dL (ref 13.0–17.0)
Lymphs Abs: 4.6 10*3/uL — ABNORMAL HIGH (ref 0.7–4.0)
MCH: 31.5 pg (ref 26.0–34.0)
MCV: 86.8 fL (ref 78.0–100.0)
Monocytes Absolute: 1.1 10*3/uL — ABNORMAL HIGH (ref 0.1–1.0)
Neutro Abs: 4.1 10*3/uL (ref 1.7–7.7)
Platelets: 208 10*3/uL (ref 150–400)
RBC: 5.15 MIL/uL (ref 4.22–5.81)

## 2013-01-14 LAB — URINALYSIS, ROUTINE W REFLEX MICROSCOPIC
Bilirubin Urine: NEGATIVE
Glucose, UA: NEGATIVE mg/dL
Ketones, ur: 15 mg/dL — AB
Specific Gravity, Urine: 1.027 (ref 1.005–1.030)
pH: 6 (ref 5.0–8.0)

## 2013-01-14 LAB — COMPREHENSIVE METABOLIC PANEL
Alkaline Phosphatase: 137 U/L — ABNORMAL HIGH (ref 39–117)
BUN: 22 mg/dL (ref 6–23)
CO2: 27 mEq/L (ref 19–32)
Chloride: 104 mEq/L (ref 96–112)
Creatinine, Ser: 1.4 mg/dL — ABNORMAL HIGH (ref 0.50–1.35)
GFR calc non Af Amer: 63 mL/min — ABNORMAL LOW (ref >90)
Potassium: 4.1 mEq/L (ref 3.5–5.1)
Total Bilirubin: 0.4 mg/dL (ref 0.3–1.2)

## 2013-01-14 LAB — URINE MICROSCOPIC-ADD ON

## 2013-01-14 MED ORDER — TAMSULOSIN HCL 0.4 MG PO CAPS
0.4000 mg | ORAL_CAPSULE | Freq: Once | ORAL | Status: AC
  Administered 2013-01-14: 0.4 mg via ORAL
  Filled 2013-01-14: qty 1

## 2013-01-14 MED ORDER — ONDANSETRON 8 MG PO TBDP: 8.0000 mg | ORAL_TABLET | Freq: Three times a day (TID) | ORAL | Status: DC | PRN

## 2013-01-14 MED ORDER — HYDROMORPHONE HCL PF 1 MG/ML IJ SOLN
1.0000 mg | Freq: Once | INTRAMUSCULAR | Status: AC
  Administered 2013-01-14: 1 mg via INTRAVENOUS
  Filled 2013-01-14: qty 1

## 2013-01-14 MED ORDER — PROMETHAZINE HCL 25 MG PO TABS
ORAL_TABLET | ORAL | Status: AC
  Administered 2013-01-14: 25 mg via ORAL
  Filled 2013-01-14: qty 1

## 2013-01-14 MED ORDER — HYDROMORPHONE HCL 4 MG PO TABS: 4.0000 mg | ORAL_TABLET | ORAL | Status: AC | PRN

## 2013-01-14 MED ORDER — ONDANSETRON HCL 4 MG/2ML IJ SOLN
4.0000 mg | Freq: Once | INTRAMUSCULAR | Status: AC
  Administered 2013-01-14: 4 mg via INTRAVENOUS
  Filled 2013-01-14: qty 2

## 2013-01-14 MED ORDER — KETOROLAC TROMETHAMINE 15 MG/ML IJ SOLN
15.0000 mg | Freq: Once | INTRAMUSCULAR | Status: AC
  Administered 2013-01-14: 15 mg via INTRAVENOUS
  Filled 2013-01-14: qty 1

## 2013-01-14 MED ORDER — PROMETHAZINE HCL 25 MG PO TABS: 25.0000 mg | ORAL_TABLET | Freq: Once | ORAL | Status: AC

## 2013-01-14 MED ORDER — NAPROXEN SODIUM 275 MG PO TABS: ORAL_TABLET | ORAL | Status: AC

## 2013-01-14 MED ORDER — TAMSULOSIN HCL 0.4 MG PO CAPS: ORAL_CAPSULE | ORAL | Status: DC

## 2013-01-14 MED ORDER — SODIUM CHLORIDE 0.9 % IV SOLN
INTRAVENOUS | Status: DC
  Administered 2013-01-14: 02:00:00 via INTRAVENOUS

## 2013-01-14 NOTE — ED Provider Notes (Signed)
History     CSN: [id number]  Arrival date & time 01/14/13  4098   First MD Initiated Contact with Patient 01/14/13 [phone]      Chief Complaint  Patient presents with  . Abdominal Pain    (Consider location/radiation/quality/duration/timing/severity/associated sxs/prior treatment) HPI This is a 37 year old male who's been having loose stools and mild right lower quadrant abdominal pain since yesterday afternoon. About an hour ago the pain became severe and has been accompanied by nausea and 2 episodes of vomiting. The pain is sharp and somewhat worse with movement or palpation; it radiates to the right groin. He is not having a fever. He still has his appendix.  Past Medical History  Diagnosis Date  . Upper abdominal pain 05/2008    u/s (-) except for fatty liver changes   . GERD (gastroesophageal reflux disease)   . Insomnia     f/u at [facility], Rx [place]  . Anxiety and depression   . Hand fracture     Left 2013   . Chronic rhinitis 05/17/2011    Past Surgical History  Procedure Laterality Date  . Refractive surgery      Family History  Problem Relation Age of Onset  . Diabetes      GM  . Heart attack      GF  . Prostate cancer Neg Hx   . Colon cancer Neg Hx   . Sudden death Neg Hx   . Hyperlipidemia Neg Hx   . Colon polyps Father     in his mid 41s   . Diabetes Maternal Grandmother   . Hypertension Maternal Grandmother   . Heart attack Maternal Grandfather     History  Substance Use Topics  . Smoking status: Former Games developer  . Smokeless tobacco: Former Neurosurgeon    Quit date: 02/27/2008  . Alcohol Use: Yes     Comment: socially       Review of Systems  All other systems reviewed and are negative.    Allergies  Review of patient's allergies indicates no known allergies.  Home Medications   Current Outpatient Rx  Name  Route  Sig  Dispense  Refill  . Azelastine-Fluticasone (DYMISTA) 137-50 MCG/ACT SUSP   Nasal   Place into the nose.         .  cefUROXime (CEFTIN) 500 MG tablet   Oral   Take 1 tablet (500 mg total) by mouth 2 (two) times daily.   6 tablet   0   . fexofenadine (ALLEGRA) 60 MG tablet   Oral   Take 60 mg by mouth daily.         . pantoprazole (PROTONIX) 40 MG tablet   Oral   Take 40 mg by mouth daily.           . predniSONE (DELTASONE) 10 MG tablet      4 tablets x 2 days, 3 tabs x 2 days, 2 tabs x 2 days, 1 tab x 2 days   20 tablet   0   . zolpidem (AMBIEN) 10 MG tablet   Oral   Take 5 mg by mouth at bedtime as needed.             BP 118/63  Pulse 68  Temp(Src) 98.4 F (36.9 C) (Oral)  Resp 20  Ht 6' (1.829 m)  Wt 260 lb (117.935 kg)  BMI 35.25 kg/m2  SpO2 96%  Physical Exam General: Well-developed, well-nourished male in no acute distress; appearance consistent with age  of record; appears uncomfortable HENT: normocephalic, atraumatic Eyes: pupils equal round and reactive to light; extraocular muscles intact Neck: supple Heart: regular rate and rhythm Lungs: clear to auscultation bilaterally Abdomen: soft; nondistended; right lower quadrant tenderness; no masses or hepatosplenomegaly; bowel sounds present GU: urine grossly bloody Extremities: No deformity; full range of motion Neurologic: Awake, alert and oriented; motor function intact in all extremities and symmetric; no facial droop Skin: Warm and dry Psychiatric: Anxious    ED Course  Procedures (including critical care time)     MDM   Nursing notes and vitals signs, including pulse oximetry, reviewed.  Summary of this visit's results, reviewed by myself:  Labs:  Results for orders placed during the hospital encounter of 01/14/13 (from the past 24 hour(s))  CBC WITH DIFFERENTIAL     Status: Abnormal   Collection Time    01/14/13  2:19 AM      Result Value Range   WBC 10.0  4.0 - 10.5 K/uL   RBC 5.15  4.22 - 5.81 MIL/uL   Hemoglobin 16.2  13.0 - 17.0 g/dL   HCT 16.1  09.6 - 04.5 %   MCV 86.8  78.0 - 100.0 fL    MCH 31.5  26.0 - 34.0 pg   MCHC 36.2 (*) 30.0 - 36.0 g/dL   RDW 40.9  81.1 - 91.4 %   Platelets 208  150 - 400 K/uL   Neutrophils Relative % 41 (*) 43 - 77 %   Lymphocytes Relative 46  12 - 46 %   Monocytes Relative 11  3 - 12 %   Eosinophils Relative 2  0 - 5 %   Basophils Relative 0  0 - 1 %   Neutro Abs 4.1  1.7 - 7.7 K/uL   Lymphs Abs 4.6 (*) 0.7 - 4.0 K/uL   Monocytes Absolute 1.1 (*) 0.1 - 1.0 K/uL   Eosinophils Absolute 0.2  0.0 - 0.7 K/uL   Basophils Absolute 0.0  0.0 - 0.1 K/uL   Smear Review MORPHOLOGY UNREMARKABLE    COMPREHENSIVE METABOLIC PANEL     Status: Abnormal   Collection Time    01/14/13  2:19 AM      Result Value Range   Sodium 141  135 - 145 mEq/L   Potassium 4.1  3.5 - 5.1 mEq/L   Chloride 104  96 - 112 mEq/L   CO2 27  19 - 32 mEq/L   Glucose, Bld 109 (*) 70 - 99 mg/dL   BUN 22  6 - 23 mg/dL   Creatinine, Ser 7.82 (*) 0.50 - 1.35 mg/dL   Calcium 9.6  8.4 - 95.6 mg/dL   Total Protein 7.2  6.0 - 8.3 g/dL   Albumin 4.1  3.5 - 5.2 g/dL   AST 16  0 - 37 U/L   ALT 15  0 - 53 U/L   Alkaline Phosphatase 137 (*) 39 - 117 U/L   Total Bilirubin 0.4  0.3 - 1.2 mg/dL   GFR calc non Af Amer 63 (*) >90 mL/min   GFR calc Af Amer 74 (*) >90 mL/min  URINALYSIS, ROUTINE W REFLEX MICROSCOPIC     Status: Abnormal   Collection Time    01/14/13  3:00 AM      Result Value Range   Color, Urine AMBER (*) YELLOW   APPearance CLOUDY (*) CLEAR   Specific Gravity, Urine 1.027  1.005 - 1.030   pH 6.0  5.0 - 8.0   Glucose, UA NEGATIVE  NEGATIVE mg/dL   Hgb urine dipstick LARGE (*) NEGATIVE   Bilirubin Urine NEGATIVE  NEGATIVE   Ketones, ur 15 (*) NEGATIVE mg/dL   Protein, ur NEGATIVE  NEGATIVE mg/dL   Urobilinogen, UA 1.0  0.0 - 1.0 mg/dL   Nitrite NEGATIVE  NEGATIVE   Leukocytes, UA NEGATIVE  NEGATIVE  URINE MICROSCOPIC-ADD ON     Status: Abnormal   Collection Time    01/14/13  3:00 AM      Result Value Range   Squamous Epithelial / LPF RARE  RARE   WBC, UA 0-2  <3  WBC/hpf   RBC / HPF TOO NUMEROUS TO COUNT  <3 RBC/hpf   Crystals CA OXALATE CRYSTALS (*) NEGATIVE   Urine-Other AMORPHOUS URATES/PHOSPHATES      Imaging Studies: Ct Abdomen Pelvis Wo Contrast  01/14/2013   *RADIOLOGY REPORT*  Clinical Data: Right lower quadrant pain and right-sided flank pain.  Nausea.  Hematuria.  CT ABDOMEN AND PELVIS WITHOUT CONTRAST  Technique:  Multidetector CT imaging of the abdomen and pelvis was performed following the standard protocol without intravenous contrast.  Comparison: No priors.  Findings:  Lung Bases: Unremarkable.  Abdomen/Pelvis:  Image 74 of series 2 demonstrates a 2 mm calculus in the distal third of the right ureter with mild proximal right- sided hydroureteronephrosis and a small right-sided perinephric stranding.  No additional calculi are noted within the collecting system of either kidney, along the course of the left ureter, or within the lumen of the urinary bladder.  No focal renal lesions are identified on today's noncontrast CT examination.  The unenhanced appearance of the liver, gallbladder, pancreas, spleen and bilateral adrenal glands is unremarkable.  No significant volume of ascites.  No pneumoperitoneum.  No pathologic distension of small bowel.  No definite pathologic lymphadenopathy. Normal appendix.  Small appendicoliths within the appendix. Prostate gland and urinary bladder are unremarkable in appearance.  Musculoskeletal: There are no aggressive appearing lytic or blastic lesions noted in the visualized portions of the skeleton.  IMPRESSION: 1.  2 mm partially obstructing calculous in the distal third of the right ureter with mild proximal right-sided hydroureternephrosis and perinephric stranding. 2.  Appendicoliths are present within the appendix, however, the appendix is otherwise normal in appearance at this time.   Original Report Authenticated By: Trudie Reed, M.D.   3:40 AM Patient comfortable at this time if any medications.  Advised of CT findings including appendicoliths.  Medications  0.9 %  sodium chloride infusion ( Intravenous New Bag/Given 01/14/13 0221)  ondansetron (ZOFRAN) injection 4 mg (4 mg Intravenous Given 01/14/13 0220)  HYDROmorphone (DILAUDID) injection 1 mg (1 mg Intravenous Given 01/14/13 0221)  ketorolac (TORADOL) 15 MG/ML injection 15 mg (15 mg Intravenous Given 01/14/13 0232)  tamsulosin (FLOMAX) capsule 0.4 mg (0.4 mg Oral Given 01/14/13 0328)  HYDROmorphone (DILAUDID) injection 1 mg (1 mg Intravenous Given 01/14/13 0329)            Hanley Seamen, MD 01/14/13 1610

## 2013-01-14 NOTE — ED Notes (Signed)
Pt c/o RLQ pain since this afternoon. Pt also c/o N/V/D.

## 2013-01-24 ENCOUNTER — Observation Stay (HOSPITAL_COMMUNITY)
Admission: EM | Admit: 2013-01-24 | Discharge: 2013-01-25 | Disposition: A | Discharge status: Home / Self Care | Payer: Commercial Indemnity | Attending: Urology | Admitting: Urology

## 2013-01-24 ENCOUNTER — Encounter (HOSPITAL_COMMUNITY): Payer: Self-pay | Admitting: Family Medicine

## 2013-01-24 ENCOUNTER — Emergency Department (HOSPITAL_COMMUNITY): Payer: Commercial Indemnity

## 2013-01-24 DIAGNOSIS — Z79899 Other long term (current) drug therapy: Secondary | ICD-10-CM | POA: Insufficient documentation

## 2013-01-24 DIAGNOSIS — N201 Calculus of ureter: Principal | ICD-10-CM | POA: Insufficient documentation

## 2013-01-24 DIAGNOSIS — K219 Gastro-esophageal reflux disease without esophagitis: Secondary | ICD-10-CM | POA: Insufficient documentation

## 2013-01-24 DIAGNOSIS — N23 Unspecified renal colic: Secondary | ICD-10-CM

## 2013-01-24 LAB — URINE MICROSCOPIC-ADD ON

## 2013-01-24 LAB — URINALYSIS, ROUTINE W REFLEX MICROSCOPIC
Bilirubin Urine: NEGATIVE
Glucose, UA: NEGATIVE mg/dL
Ketones, ur: NEGATIVE mg/dL
Leukocytes, UA: NEGATIVE
Nitrite: NEGATIVE
Specific Gravity, Urine: 1.034 — ABNORMAL HIGH (ref 1.005–1.030)
pH: 5.5 (ref 5.0–8.0)

## 2013-01-24 MED ORDER — ONDANSETRON HCL 4 MG/2ML IJ SOLN
4.0000 mg | Freq: Once | INTRAMUSCULAR | Status: AC
  Administered 2013-01-24: 4 mg via INTRAVENOUS
  Filled 2013-01-24 (×2): qty 2

## 2013-01-24 MED ORDER — SODIUM CHLORIDE 0.9 % IV SOLN
INTRAVENOUS | Status: DC
  Administered 2013-01-24: 19:00:00 via INTRAVENOUS

## 2013-01-24 MED ORDER — ONDANSETRON HCL 4 MG/2ML IJ SOLN
4.0000 mg | Freq: Once | INTRAMUSCULAR | Status: AC
  Administered 2013-01-24: 4 mg via INTRAVENOUS
  Filled 2013-01-24: qty 2

## 2013-01-24 MED ORDER — ONDANSETRON HCL 4 MG/2ML IJ SOLN
4.0000 mg | Freq: Once | INTRAMUSCULAR | Status: AC
  Administered 2013-01-24: 4 mg via INTRAVENOUS

## 2013-01-24 MED ORDER — HYDROMORPHONE HCL PF 2 MG/ML IJ SOLN
2.0000 mg | INTRAMUSCULAR | Status: AC | PRN
  Administered 2013-01-24 (×2): 2 mg via INTRAVENOUS
  Filled 2013-01-24 (×2): qty 1

## 2013-01-24 MED ORDER — ONDANSETRON HCL 4 MG/2ML IJ SOLN
INTRAMUSCULAR | Status: AC
  Filled 2013-01-24: qty 2

## 2013-01-24 NOTE — ED Notes (Addendum)
Patient states that he has a 2 mm kidney stone that he has not passed. Stated that he has pain since this morning but pain is getting worse and is not controlled by Dilaudid 4 mg. Last dose at 3pm. Last Zofran at 3pm. Had Naporoxen at 9am. States not urinating a lot. States nurse from [facility] sent patient here for evaluation.

## 2013-01-24 NOTE — ED Notes (Signed)
Patient unable to provide urine specimen at this time.

## 2013-01-24 NOTE — ED Notes (Signed)
Returned from [employer]. Patient c/o nausea.

## 2013-01-24 NOTE — ED Notes (Signed)
Patient transported to X-ray 

## 2013-01-24 NOTE — ED Provider Notes (Signed)
History    CSN: [id number] Arrival date & time 01/24/13  1737  First MD Initiated Contact with Patient 01/24/13 2010     Chief Complaint  Patient presents with  . Nephrolithiasis  . Flank Pain   (Consider location/radiation/quality/duration/timing/severity/associated sxs/prior Treatment) HPI Comments: Samuel [name] is a 37 y.o. Male who is here for evaluation of right lower quadrant abdominal pain that radiates to the right flank. The pain has been worsening over the last several days. He was diagnosed with a distal ureter stone on 01/14/13. 2 days later, he saw the urologist. At that time, a KUB was done. He is taking Dilantin 4 mg, without relief. He used it twice today before coming in. His is also taking Naprosyn and Flomax, without relief. He has urinary urgency. There is no visible hematuria. He denies fever, chills, cough, shortness of breath, or chest pain. He's had some occasional bouts of nausea with vomiting. There are no other known modifying factors.  Patient is a 37 y.o. male presenting with flank pain. The history is provided by the patient.  Flank Pain   Past Medical History  Diagnosis Date  . Upper abdominal pain 05/2008    u/s (-) except for fatty liver changes   . GERD (gastroesophageal reflux disease)   . Insomnia     f/u at [facility], Rx [place]  . Anxiety and depression   . Hand fracture     Left 2013   . Chronic rhinitis 05/17/2011   Past Surgical History  Procedure Laterality Date  . Refractive surgery     Family History  Problem Relation Age of Onset  . Diabetes      GM  . Heart attack      GF  . Prostate cancer Neg Hx   . Colon cancer Neg Hx   . Sudden death Neg Hx   . Hyperlipidemia Neg Hx   . Colon polyps Father     in his mid 43s   . Diabetes Maternal Grandmother   . Hypertension Maternal Grandmother   . Heart attack Maternal Grandfather    History  Substance Use Topics  . Smoking status: Former Games developer  . Smokeless tobacco: Former Neurosurgeon     Quit date: 02/27/2008  . Alcohol Use: Yes     Comment: socially     Review of Systems  Genitourinary: Positive for flank pain.  All other systems reviewed and are negative.    Allergies  Review of patient's allergies indicates no known allergies.  Home Medications   Current Outpatient Rx  Name  Route  Sig  Dispense  Refill  . Azelastine-Fluticasone (DYMISTA) 137-50 MCG/ACT SUSP   Nasal   Place into the nose.         . fexofenadine (ALLEGRA) 60 MG tablet   Oral   Take 60 mg by mouth daily.         Marland Kitchen HYDROmorphone (DILAUDID) 4 MG tablet   Oral   Take 1 tablet (4 mg total) by mouth every 4 (four) hours as needed for pain.   30 tablet   0   . naproxen sodium (ANAPROX) 275 MG tablet      Take 1 tablet twice daily until stone passes.   60 tablet   0   . omega-3 acid ethyl esters (LOVAZA) 1 G capsule   Oral   Take 2 g by mouth daily.         . ondansetron (ZOFRAN-ODT) 8 MG disintegrating tablet   Oral  Take 8 mg by mouth every 8 (eight) hours as needed (nausea).         . pantoprazole (PROTONIX) 40 MG tablet   Oral   Take 40 mg by mouth daily.           . tamsulosin (FLOMAX) 0.4 MG CAPS      Take 1 capsule daily until stone passes.   30 capsule   0    BP 146/77  Pulse 64  Temp(Src) 97.7 F (36.5 C) (Oral)  Resp 17  Ht 6' (1.829 m)  Wt 260 lb (117.935 kg)  BMI 35.25 kg/m2  SpO2 99% Physical Exam  Nursing note and vitals reviewed. Constitutional: He is oriented to person, place, and time. He appears well-developed and well-nourished.  HENT:  Head: Normocephalic and atraumatic.  Right Ear: External ear normal.  Left Ear: External ear normal.  Eyes: Conjunctivae and EOM are normal. Pupils are equal, round, and reactive to light.  Neck: Normal range of motion and phonation normal. Neck supple.  Cardiovascular: Normal rate, regular rhythm, normal heart sounds and intact distal pulses.   Pulmonary/Chest: Effort normal and breath sounds  normal. He exhibits no bony tenderness.  Abdominal: Soft. Normal appearance. He exhibits no mass. There is tenderness (Right lower quadrant, mild). There is no rebound and no guarding.  Musculoskeletal: Normal range of motion.  Neurological: He is alert and oriented to person, place, and time. He has normal strength. No cranial nerve deficit or sensory deficit. He exhibits normal muscle tone. Coordination normal.  Skin: Skin is warm, dry and intact.  Psychiatric: He has a normal mood and affect. His behavior is normal. Judgment and thought content normal.    ED Course  Procedures (including critical care time) Medications  0.9 %  sodium chloride infusion ( Intravenous Stopped 01/24/13 2309)  ondansetron (ZOFRAN) injection 4 mg (not administered)  HYDROmorphone (DILAUDID) injection 2 mg (2 mg Intravenous Given 01/24/13 2133)  ondansetron (ZOFRAN) injection 4 mg (4 mg Intravenous Given 01/24/13 1851)  ondansetron (ZOFRAN) injection 4 mg (4 mg Intravenous Given 01/24/13 2208)    Patient Vitals for the past 24 hrs:  BP Temp Temp src Pulse Resp SpO2 Height Weight  01/24/13 2229 146/77 mmHg - - 64 17 99 % - -  01/24/13 1813 134/84 mmHg 97.7 F (36.5 C) Oral 65 22 96 % 6' (1.829 m) 260 lb (117.935 kg)    11:40 PM Reevaluation with update and discussion. After initial assessment and treatment, an updated evaluation reveals he continues to have frequent episodes of sharp pain that lasted 90 seconds. It caused him to writhe in pain. Laneya Gasaway L   11:41 PM-Consult complete with [place]. Patient case explained and discussed. He agrees to admit patient for further evaluation and treatment. Call ended at 2345      Labs Reviewed  URINALYSIS, ROUTINE W REFLEX MICROSCOPIC - Abnormal; Notable for the following:    Specific Gravity, Urine 1.034 (*)    Hgb urine dipstick TRACE (*)    All other components within normal limits  URINE MICROSCOPIC-ADD ON   Dg Abd 1 View  01/24/2013   *RADIOLOGY  REPORT*  Clinical Data: Right flank pain.  Nausea and vomiting.  History of stone 2 weeks ago.  ABDOMEN - 1 VIEW  Comparison: 01/16/2013  Findings: There was previously a 2 mm stone in the right iliac vessel crossover on 01/14/2013.  There are also several right-sided pelvic phleboliths a triangular calcific density adjacent one of these phleboliths is new and  may reflect migration of the prior stone into the distal ureter.  IMPRESSION:  1.  Probable migration of the right sided stone into the distal ureter.  There is a new somewhat triangular calcific density adjacent to the larger right-sided pelvic phlebolith.   Original Report Authenticated By: Gaylyn Rong, M.D.   1. Ureteral stone   2. Ureteral colic     MDM  Retained ureteral stone with persistent pain. Doubt UTI, serious bacterial infection or acute renal failure.   Plan: Admit to urology  Flint Melter, MD 01/24/13 [phone]

## 2013-01-25 ENCOUNTER — Encounter (HOSPITAL_COMMUNITY): Payer: Self-pay | Admitting: *Deleted

## 2013-01-25 ENCOUNTER — Observation Stay (HOSPITAL_COMMUNITY): Payer: Commercial Indemnity | Admitting: *Deleted

## 2013-01-25 ENCOUNTER — Encounter (HOSPITAL_COMMUNITY): Admission: EM | Disposition: A | Discharge status: Home / Self Care | Payer: Self-pay | Source: Home / Self Care

## 2013-01-25 ENCOUNTER — Observation Stay (HOSPITAL_COMMUNITY): Payer: Commercial Indemnity

## 2013-01-25 HISTORY — PX: CYSTOSCOPY WITH RETROGRADE PYELOGRAM, URETEROSCOPY AND STENT PLACEMENT: SHX5789

## 2013-01-25 LAB — BASIC METABOLIC PANEL
BUN: 19 mg/dL (ref 6–23)
Creatinine, Ser: 1.25 mg/dL (ref 0.50–1.35)
GFR calc Af Amer: 84 mL/min — ABNORMAL LOW (ref >90)
GFR calc non Af Amer: 72 mL/min — ABNORMAL LOW (ref >90)
Glucose, Bld: 93 mg/dL (ref 70–99)

## 2013-01-25 LAB — CREATININE, SERUM
Creatinine, Ser: 1.52 mg/dL — ABNORMAL HIGH (ref 0.50–1.35)
GFR calc non Af Amer: 57 mL/min — ABNORMAL LOW (ref >90)

## 2013-01-25 LAB — CBC
MCH: 30.1 pg (ref 26.0–34.0)
MCHC: 34.8 g/dL (ref 30.0–36.0)
RDW: 12.9 % (ref 11.5–15.5)

## 2013-01-25 SURGERY — CYSTOURETEROSCOPY, WITH RETROGRADE PYELOGRAM AND STENT INSERTION
Anesthesia: General | Site: Urethra | Laterality: Right | Wound class: Clean Contaminated

## 2013-01-25 MED ORDER — DEXAMETHASONE SODIUM PHOSPHATE 4 MG/ML IJ SOLN
INTRAMUSCULAR | Status: DC | PRN
  Administered 2013-01-25: 10 mg via INTRAVENOUS

## 2013-01-25 MED ORDER — DEXTROSE 5 % IV SOLN
3.0000 g | INTRAVENOUS | Status: DC
  Filled 2013-01-25: qty 3000

## 2013-01-25 MED ORDER — MEPERIDINE HCL 25 MG/ML IJ SOLN
6.2500 mg | INTRAMUSCULAR | Status: DC | PRN
  Filled 2013-01-25: qty 1

## 2013-01-25 MED ORDER — ENOXAPARIN SODIUM 40 MG/0.4ML ~~LOC~~ SOLN
40.0000 mg | SUBCUTANEOUS | Status: DC
  Filled 2013-01-25: qty 0.4

## 2013-01-25 MED ORDER — ONDANSETRON HCL 4 MG/2ML IJ SOLN
INTRAMUSCULAR | Status: DC | PRN
  Administered 2013-01-25: 4 mg via INTRAVENOUS

## 2013-01-25 MED ORDER — DIPHENHYDRAMINE HCL 12.5 MG/5ML PO ELIX: 12.5000 mg | ORAL_SOLUTION | Freq: Four times a day (QID) | ORAL | Status: DC | PRN

## 2013-01-25 MED ORDER — SODIUM CHLORIDE 0.9 % IR SOLN
Status: DC | PRN
  Administered 2013-01-25: 3000 mL

## 2013-01-25 MED ORDER — LACTATED RINGERS IV SOLN
INTRAVENOUS | Status: DC | PRN
  Administered 2013-01-25: 14:00:00 via INTRAVENOUS

## 2013-01-25 MED ORDER — OXYCODONE HCL 5 MG PO TABS: 5.0000 mg | ORAL_TABLET | ORAL | Status: DC | PRN

## 2013-01-25 MED ORDER — LORATADINE 10 MG PO TABS
10.0000 mg | ORAL_TABLET | Freq: Every day | ORAL | Status: DC
  Filled 2013-01-25: qty 1

## 2013-01-25 MED ORDER — ONDANSETRON HCL 4 MG/2ML IJ SOLN: 4.0000 mg | Freq: Four times a day (QID) | INTRAMUSCULAR | Status: DC | PRN

## 2013-01-25 MED ORDER — FENTANYL CITRATE 0.05 MG/ML IJ SOLN
INTRAMUSCULAR | Status: DC | PRN
  Administered 2013-01-25: 100 ug via INTRAVENOUS
  Administered 2013-01-25: 50 ug via INTRAVENOUS

## 2013-01-25 MED ORDER — NALOXONE HCL 0.4 MG/ML IJ SOLN: 0.4000 mg | INTRAMUSCULAR | Status: DC | PRN

## 2013-01-25 MED ORDER — KETOROLAC TROMETHAMINE 30 MG/ML IJ SOLN
INTRAMUSCULAR | Status: DC | PRN
  Administered 2013-01-25: 30 mg via INTRAVENOUS

## 2013-01-25 MED ORDER — ACETAMINOPHEN 10 MG/ML IV SOLN
1000.0000 mg | Freq: Four times a day (QID) | INTRAVENOUS | Status: DC
  Administered 2013-01-25 (×2): 1000 mg via INTRAVENOUS
  Filled 2013-01-25 (×4): qty 100

## 2013-01-25 MED ORDER — MIDAZOLAM HCL 5 MG/5ML IJ SOLN
INTRAMUSCULAR | Status: DC | PRN
  Administered 2013-01-25 (×2): 2 mg via INTRAVENOUS

## 2013-01-25 MED ORDER — PROMETHAZINE HCL 25 MG/ML IJ SOLN: 6.2500 mg | INTRAMUSCULAR | Status: DC | PRN

## 2013-01-25 MED ORDER — CIPROFLOXACIN HCL 250 MG PO TABS: 250.0000 mg | ORAL_TABLET | Freq: Two times a day (BID) | ORAL | Status: DC

## 2013-01-25 MED ORDER — 0.9 % SODIUM CHLORIDE (POUR BTL) OPTIME
TOPICAL | Status: DC | PRN
  Administered 2013-01-25: 1000 mL

## 2013-01-25 MED ORDER — ACETAMINOPHEN 650 MG RE SUPP: 650.0000 mg | RECTAL | Status: DC | PRN

## 2013-01-25 MED ORDER — PROPOFOL 10 MG/ML IV BOLUS
INTRAVENOUS | Status: DC | PRN
  Administered 2013-01-25: 200 mg via INTRAVENOUS

## 2013-01-25 MED ORDER — SODIUM CHLORIDE 0.9 % IJ SOLN: 9.0000 mL | INTRAMUSCULAR | Status: DC | PRN

## 2013-01-25 MED ORDER — SODIUM CHLORIDE 0.9 % IV SOLN: Freq: Once | INTRAVENOUS | Status: DC

## 2013-01-25 MED ORDER — MIDAZOLAM HCL 2 MG/2ML IJ SOLN: 0.5000 mg | Freq: Once | INTRAMUSCULAR | Status: DC | PRN

## 2013-01-25 MED ORDER — LORAZEPAM 2 MG/ML IJ SOLN
2.0000 mg | Freq: Four times a day (QID) | INTRAMUSCULAR | Status: DC | PRN
  Filled 2013-01-25: qty 1

## 2013-01-25 MED ORDER — KETAMINE HCL 10 MG/ML IJ SOLN
INTRAMUSCULAR | Status: DC | PRN
  Administered 2013-01-25: 20 mg via INTRAVENOUS

## 2013-01-25 MED ORDER — METOCLOPRAMIDE HCL 5 MG/ML IJ SOLN
INTRAMUSCULAR | Status: DC | PRN
  Administered 2013-01-25: 10 mg via INTRAVENOUS

## 2013-01-25 MED ORDER — SODIUM CHLORIDE 0.9 % IV SOLN
INTRAVENOUS | Status: DC
  Administered 2013-01-25: 01:00:00 via INTRAVENOUS

## 2013-01-25 MED ORDER — SODIUM CHLORIDE 0.9 % IV SOLN: 250.0000 mL | INTRAVENOUS | Status: DC | PRN

## 2013-01-25 MED ORDER — FENTANYL CITRATE 0.05 MG/ML IJ SOLN: 25.0000 ug | INTRAMUSCULAR | Status: DC | PRN

## 2013-01-25 MED ORDER — CHLORHEXIDINE GLUCONATE 0.12 % MT SOLN
15.0000 mL | Freq: Two times a day (BID) | OROMUCOSAL | Status: DC
  Administered 2013-01-25: 15 mL via OROMUCOSAL
  Filled 2013-01-25 (×3): qty 15

## 2013-01-25 MED ORDER — IOHEXOL 300 MG/ML  SOLN
INTRAMUSCULAR | Status: DC | PRN
  Administered 2013-01-25: 50 mL via INTRAVENOUS

## 2013-01-25 MED ORDER — CIPROFLOXACIN IN D5W 400 MG/200ML IV SOLN
INTRAVENOUS | Status: DC | PRN
  Administered 2013-01-25: 400 mg via INTRAVENOUS

## 2013-01-25 MED ORDER — PANTOPRAZOLE SODIUM 40 MG PO TBEC
40.0000 mg | DELAYED_RELEASE_TABLET | Freq: Every day | ORAL | Status: DC
  Filled 2013-01-25: qty 1

## 2013-01-25 MED ORDER — DIPHENHYDRAMINE HCL 50 MG/ML IJ SOLN
12.5000 mg | Freq: Four times a day (QID) | INTRAMUSCULAR | Status: DC | PRN
  Administered 2013-01-25: 12.5 mg via INTRAVENOUS
  Filled 2013-01-25: qty 1

## 2013-01-25 MED ORDER — ACETAMINOPHEN 325 MG PO TABS: 650.0000 mg | ORAL_TABLET | ORAL | Status: DC | PRN

## 2013-01-25 MED ORDER — HYDROMORPHONE 0.3 MG/ML IV SOLN
INTRAVENOUS | Status: DC
  Administered 2013-01-25: 0.3 mg via INTRAVENOUS
  Administered 2013-01-25: 1.3 mg via INTRAVENOUS
  Administered 2013-01-25: 01:00:00 via INTRAVENOUS
  Filled 2013-01-25: qty 25

## 2013-01-25 MED ORDER — BIOTENE DRY MOUTH MT LIQD: 15.0000 mL | Freq: Two times a day (BID) | OROMUCOSAL | Status: DC

## 2013-01-25 MED ORDER — DOCUSATE SODIUM 100 MG PO CAPS
100.0000 mg | ORAL_CAPSULE | Freq: Two times a day (BID) | ORAL | Status: DC
  Filled 2013-01-25 (×3): qty 1

## 2013-01-25 MED ORDER — SODIUM CHLORIDE 0.9 % IJ SOLN: 3.0000 mL | INTRAMUSCULAR | Status: DC | PRN

## 2013-01-25 MED ORDER — SODIUM CHLORIDE 0.9 % IJ SOLN: 3.0000 mL | Freq: Two times a day (BID) | INTRAMUSCULAR | Status: DC

## 2013-01-25 SURGICAL SUPPLY — 17 items
ADAPTER CATH URET PLST 4-6FR (CATHETERS) ×2 IMPLANT
BAG URO CATCHER STRL LF (DRAPE) ×2 IMPLANT
BASKET ZERO TIP NITINOL 2.4FR (BASKET) ×2 IMPLANT
CATH INTERMIT  6FR 70CM (CATHETERS) ×2 IMPLANT
CATH URET 5FR 28IN CONE TIP (BALLOONS)
CATH URET 5FR 70CM CONE TIP (BALLOONS) IMPLANT
CLOTH BEACON ORANGE TIMEOUT ST (SAFETY) ×2 IMPLANT
DRAPE CAMERA CLOSED 9X96 (DRAPES) ×2 IMPLANT
GLOVE BIOGEL M 8.0 STRL (GLOVE) ×2 IMPLANT
GOWN PREVENTION PLUS XLARGE (GOWN DISPOSABLE) IMPLANT
GOWN STRL REIN XL XLG (GOWN DISPOSABLE) ×4 IMPLANT
GUIDEWIRE STR DUAL SENSOR (WIRE) ×2 IMPLANT
MANIFOLD NEPTUNE II (INSTRUMENTS) ×2 IMPLANT
PACK CYSTO (CUSTOM PROCEDURE TRAY) ×2 IMPLANT
SHEATH URET ACCESS 12FR/35CM (UROLOGICAL SUPPLIES) ×2 IMPLANT
TUBING CONNECTING 10 (TUBING) ×2 IMPLANT
WIRE COONS/BENSON .038X145CM (WIRE) IMPLANT

## 2013-01-25 NOTE — H&P (Signed)
Urology History and Physical Exam  CC: Kidney stone  HPI: 37 year old male who I admitted for significant pain associated with a right distal ureteral stone. He has been laboring with stone discomfort for about 2 weeks. He first presented to the emergency room on June 17. CT scan revealed a 3 mm right midureteral stone. He followed up in our office with Dr. Bjorn Pippin, and was given by mouth Dilaudid. Because of significant pain over the past 24 hours, as well as nausea and vomiting, he presented again to the emergency room here at [facility]. KUB was obtained which revealed a 3.5 mm stone which had progressed more distally. The patient required significant IV [place], and was still uncomfortable. He is admitted for further management.  PMH: Past Medical History  Diagnosis Date  . Upper abdominal pain 05/2008    u/s (-) except for fatty liver changes   . GERD (gastroesophageal reflux disease)   . Insomnia     f/u at [facility], Rx [place]  . Anxiety and depression   . Hand fracture     Left 2013   . Chronic rhinitis 05/17/2011    PSH: Past Surgical History  Procedure Laterality Date  . Refractive surgery      Allergies: No Known Allergies  Medications: Prescriptions prior to admission  Medication Sig Dispense Refill  . Azelastine-Fluticasone (DYMISTA) 137-50 MCG/ACT SUSP Place into the nose.      . fexofenadine (ALLEGRA) 60 MG tablet Take 60 mg by mouth daily.      Marland Kitchen HYDROmorphone (DILAUDID) 4 MG tablet Take 1 tablet (4 mg total) by mouth every 4 (four) hours as needed for pain.  30 tablet  0  . naproxen sodium (ANAPROX) 275 MG tablet Take 1 tablet twice daily until stone passes.  60 tablet  0  . omega-3 acid ethyl esters (LOVAZA) 1 G capsule Take 2 g by mouth daily.      . ondansetron (ZOFRAN-ODT) 8 MG disintegrating tablet Take 8 mg by mouth every 8 (eight) hours as needed (nausea).      . pantoprazole (PROTONIX) 40 MG tablet Take 40 mg by mouth daily.        . tamsulosin  (FLOMAX) 0.4 MG CAPS Take 1 capsule daily until stone passes.  30 capsule  0     Social History: History   Social History  . Marital Status: Single    Spouse Name: N/A    Number of Children: 0  . Years of Education: N/A   Occupational History  . Retired from [employer]   . Emergency planning/management officer in [employer] [place]    Social History Main Topics  . Smoking status: Former Games developer  . Smokeless tobacco: Former Neurosurgeon    Quit date: 02/27/2008  . Alcohol Use: Yes     Comment: socially   . Drug Use: No  . Sexually Active: Not on file   Other Topics Concern  . Not on file   Social History Narrative   Regular exercise- yes (6-7 days/wk for about 1 hour)   Diet- has improved    Caffeine- 2-5 cups coffee once daily          Family History: Family History  Problem Relation Age of Onset  . Diabetes      GM  . Heart attack      GF  . Prostate cancer Neg Hx   . Colon cancer Neg Hx   . Sudden death Neg Hx   . Hyperlipidemia Neg  Hx   . Colon polyps Father     in his mid 40s   . Diabetes Maternal Grandmother   . Hypertension Maternal Grandmother   . Heart attack Maternal Grandfather     Review of Systems: Positive: Right flank pain, nausea, vomiting, urgency, frequency. Gross hematuria. Negative:   A further 10 point review of systems was negative except what is listed in the HPI.  Physical Exam: @VITALS2 @ General: No acute distress.  Awake. Head:  Normocephalic.  Atraumatic. ENT:  EOMI.  Mucous membranes moist Neck:  Supple.  No lymphadenopathy. CV:  S1 present. S2 present. Regular rate. Pulmonary: Equal effort bilaterally.  Clear to auscultation bilaterally. Abdomen: Soft.  Non- tender to palpation. Skin:  Normal turgor.  No visible rash. Extremity: No gross deformity of bilateral upper extremities.  No gross deformity of    bilateral lower extremities. Neurologic: Alert. Appropriate mood.    Studies:  Recent Labs     01/25/13  0425  HGB  14.1  WBC  10.4  PLT  162     Recent Labs     01/25/13  0425  CREATININE  1.52*  GFRNONAA  57*  GFRAA  66*     No results found for this basename: PT, INR, APTT,  in the last 72 hours   No components found with this basename: ABG,   I reviewed the patient's KUB as well as prior CT scan. He also reviewed the images with me. He has a triangular 3.7 mm stone proximal to the right UVJ. No other calculi are noted.  Assessment:  Right ureteral stone  Plan: Admit for pain management and possible stone extraction. I discussed further management with the patient, and discussed ureteroscopic stone extraction. Risks and complications were discussed as well. I will repeat KUB this morning, and we will decide therapeutic strategy after that. There has been more distal progression of the stone, we might hold off. Otherwise, we will proceed with surgery.

## 2013-01-25 NOTE — Progress Notes (Signed)
Pt D/C home, alert and oriented, pain improved, no new complains. Pt denies nausea /vomitting. Pt voided once after procedure, urine is bright red.  Family at bed side. D/C instructions done, medication administration instructions done done, pt verbalizes understanding.

## 2013-01-25 NOTE — Anesthesia Preprocedure Evaluation (Signed)
Anesthesia Evaluation  Patient identified by MRN, date of birth, ID band Patient awake    Reviewed: Allergy & Precautions, H&P , Patient's Chart, lab work & pertinent test results, reviewed documented beta blocker date and time   History of Anesthesia Complications Negative for: history of anesthetic complications  Airway Mallampati: III TM Distance: >3 FB Neck ROM: full    Dental no notable dental hx.    Pulmonary neg pulmonary ROS,  breath sounds clear to auscultation  Pulmonary exam normal       Cardiovascular Exercise Tolerance: Good negative cardio ROS  Rhythm:regular Rate:Normal     Neuro/Psych PSYCHIATRIC DISORDERS Depression negative neurological ROS  negative psych ROS   GI/Hepatic negative GI ROS, Neg liver ROS, GERD-  Controlled,  Endo/Other  negative endocrine ROS  Renal/GU negative Renal ROS     Musculoskeletal   Abdominal   Peds  Hematology negative hematology ROS (+)   Anesthesia Other Findings  Upper abdominal pain 05/2008 u/s (-) except for fatty liver changes  GERD (gastroesophageal reflux disease)        Insomnia   f/u at [place], Rx [place] Anxiety and depression        Hand fracture   Left 2013  Chronic rhinitis 05/17/2011         Reproductive/Obstetrics negative OB ROS                           Anesthesia Physical Anesthesia Plan  ASA: II  Anesthesia Plan: General LMA   Post-op Pain Management:    Induction:   Airway Management Planned:   Additional Equipment:   Intra-op Plan:   Post-operative Plan:   Informed Consent: I have reviewed the patients History and Physical, chart, labs and discussed the procedure including the risks, benefits and alternatives for the proposed anesthesia with the patient or authorized representative who has indicated his/her understanding and acceptance.   Dental Advisory Given  Plan Discussed with: CRNA, Surgeon and  Anesthesiologist  Anesthesia Plan Comments:         Anesthesia Quick Evaluation

## 2013-01-25 NOTE — Transfer of Care (Signed)
Immediate Anesthesia Transfer of Care Note  Patient: Samuel [name]  Procedure(s) Performed: Procedure(s) with comments: CYSTOSCOPY WITH RIGHT RETROGRADE PYELOGRAM,RIGHT URETEROSCOPIC STONE EXTRACTION (Right) - RIGHT URETEROSCOPIC STONE EXTRACTION  Patient Location: PACU  Anesthesia Type:General  Level of Consciousness: awake, alert , oriented, patient cooperative and responds to stimulation  Airway & Oxygen Therapy: Patient Spontanous Breathing and Patient connected to face mask oxygen  Post-op Assessment: Report given to PACU RN, Post -op Vital signs reviewed and stable and Patient moving all extremities X 4  Post vital signs: Reviewed and stable  Complications: No apparent anesthesia complications

## 2013-01-25 NOTE — Preoperative (Signed)
Beta Blockers   Reason not to administer Beta Blockers:Not Applicable, not on home BB 

## 2013-01-25 NOTE — Op Note (Signed)
Preoperative diagnosis: Right distal ureteral stone  Postoperative diagnosis: Same   Procedure: Cystoscopy, right retrograde ureteropyelogram with interpretive fluoroscopy, right ureteroscopic stone extraction    Surgeon: Bertram Millard. Taleyah Hillman, M.D.   Anesthesia: Gen.   Complications: None  Specimen(s): Right ureteral stone, to patient  Drain(s): None  Indications: 37 year old male with a two-week history of flank pain from a right ureteral stone. This has progressed distally, but the patient is presented to medical attention 3 times in the past 2 weeks, most notably last night with significant flank pain, nausea and vomiting. Despite his distal stone, and he desires to have management. We have talked about ureteroscopy as primary management with extraction of the stone. He is aware of the procedure as well as risks and complications and desires to proceed.    Technique and findings: The patient was properly identified and marked in the holding area. He received preoperative IV antibiotics. He was taken to the operating room where general anesthetic was administered. He was placed in the dorsolithotomy position. Genitalia and perineum were prepped and draped, time out was then performed.  I passed a 22 French panendoscope through his urethra which was normal. Prostate was nonobstructive. The bladder was entered and inspected circumferentially. No tumors trabeculations or foreign bodies were noted. Ureteral orifices were normal in configuration and location. The right ureter was cannulated with an open-ended catheter with the help of the guidewire. Retrograde ureteropyelogram was then performed. There was a distal filling defect consistent with the stone. Possible hydroureter and pyelocaliectasis, moderate, or noted. No other filling defects were seen. I removed the open-ended catheter, as well as the cystoscope. I then passed under direct vision a 6 French rigid ureteroscope, following the  guidewire that had previously been placed prior to removal of the open-ended catheter. I entered the ureter and encountered the stone about 3 cm up. He was grasped with the Nitinol basket and extracted. It should be noted that the ureter was dilated distally with a ureteral access catheter prior to ureteroscopy. I then replaced the ureteroscope more proximally in the ureter, where no abnormalities or stones were seen. I did not think the patient needed to have a stent. The scope was removed, the bladder drained, and the procedure terminated. The patient was awakened and taken to the PACU in stable condition.  I would like him to followup in 2-3 weeks to check his urine. We will arrange that. He was sent home on 3 days of Cipro as well as with the hydromorphone he had at home.

## 2013-01-25 NOTE — Anesthesia Postprocedure Evaluation (Signed)
Anesthesia Post Note  Patient: Samuel [name]  Procedure(s) Performed: Procedure(s) (LRB): CYSTOSCOPY WITH RIGHT RETROGRADE PYELOGRAM,RIGHT URETEROSCOPIC STONE EXTRACTION (Right)  Anesthesia type: GA  Patient location: PACU  Post pain: Pain level controlled  Post assessment: Post-op Vital signs reviewed  Last Vitals:  Filed Vitals:   01/25/13 1015  BP: 113/69  Pulse: 55  Temp: 36.9 C  Resp: 16    Post vital signs: Reviewed  Level of consciousness: sedated  Complications: No apparent anesthesia complications

## 2013-01-27 ENCOUNTER — Encounter (HOSPITAL_COMMUNITY): Payer: Self-pay | Admitting: Urology

## 2013-02-13 NOTE — Discharge Summary (Signed)
Patient ID: Samuel [name] MRN: [id number] DOB/AGE: [birth year] 37 y.o.  Admit date: 01/24/2013 Discharge date: 02/13/2013  Primary Care Physician:  Willow Ora, MD  Discharge Diagnoses:   Right ureteral stone  Consults:  None     Discharge Medications:   Medication List    STOP taking these medications       tamsulosin 0.4 MG Caps  Commonly known as:  FLOMAX      TAKE these medications       ciprofloxacin 250 MG tablet  Commonly known as:  CIPRO  Take 1 tablet (250 mg total) by mouth 2 (two) times daily.     DYMISTA 137-50 MCG/ACT Susp  Generic drug:  Azelastine-Fluticasone  Place into the nose.     fexofenadine 60 MG tablet  Commonly known as:  ALLEGRA  Take 60 mg by mouth daily.     HYDROmorphone 4 MG tablet  Commonly known as:  DILAUDID  Take 1 tablet (4 mg total) by mouth every 4 (four) hours as needed for pain.     naproxen sodium 275 MG tablet  Commonly known as:  ANAPROX  Take 1 tablet twice daily until stone passes.     omega-3 acid ethyl esters 1 G capsule  Commonly known as:  LOVAZA  Take 2 g by mouth daily.     ondansetron 8 MG disintegrating tablet  Commonly known as:  ZOFRAN-ODT  Take 8 mg by mouth every 8 (eight) hours as needed (nausea).     pantoprazole 40 MG tablet  Commonly known as:  PROTONIX  Take 40 mg by mouth daily.         Significant Diagnostic Studies:  Dg Abd 1 View  01/25/2013   *RADIOLOGY REPORT*  Clinical Data: Follow-up renal calculus  ABDOMEN - 1 VIEW  Comparison: 01/24/2013  Findings: The triangular calculus projecting over the right side of the sacrum is stable. Pelvic phleboliths are stable. No disproportionate dilatation of bowel.  Moderate degenerative change of the hip joints.  IMPRESSION: Stable distal right ureteral calculus.   Original Report Authenticated By: Jolaine Click, M.D.   Dg Abd 1 View  01/24/2013   *RADIOLOGY REPORT*  Clinical Data: Right flank pain.  Nausea and vomiting.  History of stone 2 weeks  ago.  ABDOMEN - 1 VIEW  Comparison: 01/16/2013  Findings: There was previously a 2 mm stone in the right iliac vessel crossover on 01/14/2013.  There are also several right-sided pelvic phleboliths a triangular calcific density adjacent one of these phleboliths is new and may reflect migration of the prior stone into the distal ureter.  IMPRESSION:  1.  Probable migration of the right sided stone into the distal ureter.  There is a new somewhat triangular calcific density adjacent to the larger right-sided pelvic phlebolith.   Original Report Authenticated By: Gaylyn Rong, M.D.    Brief H and P: For complete details please refer to admission H and P, but in brief the patient was admitted for pain management of a ureteral stone and eventual right ureteroscopic stone extraction.  Hospital Course:  The patient was admitted to the hospital, and because of persistent pain, underwent right ureteroscopic stone extraction. He tolerated the procedure well and was discharged following the procedure.  Day of Discharge BP 130/74  Pulse 62  Temp(Src) 98.2 F (36.8 C) (Oral)  Resp 14  Ht 6' (1.829 m)  Wt 122.7 kg (270 lb 8.1 oz)  BMI 36.68 kg/m2  SpO2 93%  No results found for this  or any previous visit (from the past 24 hour(s)).  Physical Exam: General: Alert and awake oriented x3 not in any acute distress. HEENT: anicteric sclera, pupils reactive to light and accommodation CVS: S1-S2 clear no murmur rubs or gallops Chest: clear to auscultation bilaterally, no wheezing rales or rhonchi Abdomen: soft nontender, nondistended, normal bowel sounds, no organomegaly Extremities: no cyanosis, clubbing or edema noted bilaterally Neuro: Cranial nerves II-XII intact, no focal neurological deficits  Disposition:  Home  Diet:  No restrictions  Activity:  No restrictions   Disposition and Follow-up:      Future Appointments Provider Department Dept Phone   02/14/2013 3:45 PM Lelon Perla, DO  [facility] at  [address] [phone]         TESTS THAT NEED FOLLOW-UP Recheck of urinalysis  DISCHARGE FOLLOW-UP Follow-up Information   Follow up with Mark Hassey, Bertram Millard, MD. (We will call to set up an appointment for postoperative followup with a nurse practitioner)    Contact information:   [address] [place] [address] [phone]       Time spent on Discharge:  15 minutes  Signed: Chelsea Aus 02/13/2013, 4:04 PM

## 2013-02-14 ENCOUNTER — Encounter: Payer: Self-pay | Admitting: Family Medicine

## 2013-02-14 ENCOUNTER — Ambulatory Visit (INDEPENDENT_AMBULATORY_CARE_PROVIDER_SITE_OTHER): Payer: BC Managed Care – PPO | Admitting: Family Medicine

## 2013-02-14 MED ORDER — LORCASERIN HCL 10 MG PO TABS: 1.0000 | ORAL_TABLET | Freq: Two times a day (BID) | ORAL | Status: DC

## 2013-02-14 NOTE — Patient Instructions (Addendum)
Obesity Obesity is defined as having too much total body fat and a body mass index (BMI) of 30 or more. BMI is an estimate of body fat and is calculated from your height and weight. Obesity happens when you consume more calories than you can burn by exercising or performing daily physical tasks. Prolonged obesity can cause major illnesses or emergencies, such as:   A stroke.  Heart disease.  Diabetes.  Cancer.  Arthritis.  High blood pressure (hypertension).  High cholesterol.  Sleep apnea.  Erectile dysfunction.  Infertility problems. CAUSES   Regularly eating unhealthy foods.  Physical inactivity.  Certain disorders, such as an underactive thyroid (hypothyroidism), Cushing's syndrome, and polycystic ovarian syndrome.  Certain medicines, such as steroids, some depression medicines, and antipsychotics.  Genetics.  Lack of sleep. DIAGNOSIS  A caregiver can diagnose obesity after calculating your BMI. Obesity will be diagnosed if your BMI is 30 or higher.  There are other methods of measuring obesity levels. Some other methods include measuring your skin fold thickness, your waist circumference, and comparing your hip circumference to your waist circumference. TREATMENT  A healthy treatment program includes some or all of the following:  Long-term dietary changes.  Exercise and physical activity.  Behavioral and lifestyle changes.  Medicine only under the supervision of your caregiver. Medicines may help, but only if they are used with diet and exercise programs. An unhealthy treatment program includes:  Fasting.  Fad diets.  Supplements and drugs. These choices do not succeed in long-term weight control.  HOME CARE INSTRUCTIONS   Exercise and perform physical activity as directed by your caregiver. To increase physical activity, try the following:  Use stairs instead of elevators.  Park farther away from store entrances.  Garden, bike, or walk instead of  watching television or using the computer.  Eat healthy, low-calorie foods and drinks on a regular basis. Eat more fruits and vegetables. Use low-calorie cookbooks or take healthy cooking classes.  Limit fast food, sweets, and processed snack foods.  Eat smaller portions.  Keep a daily journal of everything you eat. There are many free websites to help you with this. It may be helpful to measure your foods so you can determine if you are eating the correct portion sizes.  Avoid drinking alcohol. Drink more water and drinks without calories.  Take vitamins and supplements only as recommended by your caregiver.  Weight-loss support groups, Registered Dieticians, counselors, and stress reduction education can also be very helpful. SEEK IMMEDIATE MEDICAL CARE IF:  You have chest pain or tightness.  You have trouble breathing or feel short of breath.  You have weakness or leg numbness.  You feel confused or have trouble talking.  You have sudden changes in your vision. MAKE SURE YOU:  Understand these instructions.  Will watch your condition.  Will get help right away if you are not doing well or get worse. Document Released: 08/24/2004 Document Revised: 01/16/2012 Document Reviewed: 08/23/2011 Exit[employer] Patient Information 2014 [employer].  

## 2013-02-15 NOTE — Assessment & Plan Note (Signed)
belviq 10 mg bid rto 1 month Discussed diet and exercise

## 2013-02-15 NOTE — Progress Notes (Signed)
Subjective:    Patient ID: Samuel [name], male    DOB: 04/04/1976, 37 y.o.   MRN: [id number]  HPI Pt here to discuss weight loss.  He recently had a kidney stone and needed surgery.  He is now concerned about losing weight.  He is asking about belviq.  He is exercising and eats healthy foods.  His appetite is big.   Review of Systems As above    Past Medical History  Diagnosis Date  . Upper abdominal pain 05/2008    u/s (-) except for fatty liver changes   . GERD (gastroesophageal reflux disease)   . Insomnia     f/u at [facility], Rx [place]  . Anxiety and depression   . Hand fracture     Left 2013   . Chronic rhinitis 05/17/2011   History   Social History  . Marital Status: Single    Spouse Name: N/A    Number of Children: 0  . Years of Education: N/A   Occupational History  . Retired from [employer]   . Emergency planning/management officer in [employer] [place]    Social History Main Topics  . Smoking status: Former Games developer  . Smokeless tobacco: Former Neurosurgeon    Quit date: 02/27/2008  . Alcohol Use: Yes     Comment: socially   . Drug Use: No  . Sexually Active: Not on file   Other Topics Concern  . Not on file   Social History Narrative   Regular exercise- yes (6-7 days/wk for about )   Diet- has improved    Caffeine- 2-5 cups coffee once daily            Current Outpatient Prescriptions on File Prior to Visit  Medication Sig Dispense Refill  . fexofenadine (ALLEGRA) 60 MG tablet Take 60 mg by mouth daily.      Marland Kitchen omega-3 acid ethyl esters (LOVAZA) 1 G capsule Take 2 g by mouth daily.      . Azelastine-Fluticasone (DYMISTA) 137-50 MCG/ACT SUSP Place into the nose.      Marland Kitchen HYDROmorphone (DILAUDID) 4 MG tablet Take 1 tablet (4 mg total) by mouth every 4 (four) hours as needed for pain.  30 tablet  0  . naproxen sodium (ANAPROX) 275 MG tablet Take 1 tablet twice daily until stone passes.  60 tablet  0  . ondansetron (ZOFRAN-ODT) 8 MG disintegrating tablet Take 8 mg by mouth every 8  (eight) hours as needed (nausea).      . pantoprazole (PROTONIX) 40 MG tablet Take 40 mg by mouth daily.         No current facility-administered medications on file prior to visit.   Family History  Problem Relation Age of Onset  . Diabetes      GM  . Heart attack      GF  . Prostate cancer Neg Hx   . Colon cancer Neg Hx   . Sudden death Neg Hx   . Colon polyps Father     in his mid 45s   . Diabetes Maternal Grandmother   . Hypertension Maternal Grandmother   . Hyperlipidemia Maternal Grandmother   . Heart attack Maternal Grandfather   . Hypertension Maternal Grandfather   . Hyperlipidemia Maternal Grandfather   . Diabetes Maternal Grandfather   . Hyperlipidemia Paternal Grandmother   . Hypertension Paternal Grandmother   . Diabetes Paternal Grandmother   . Stroke Paternal Grandfather   . Hypertension Paternal Grandfather   . Hyperlipidemia Paternal Grandfather   .  Diabetes Paternal Grandfather    No Known Allergies  Objective:   Physical Exam BP 128/80  Pulse 90  Temp(Src) 98.4 F (36.9 C) (Oral)  Wt 273 lb 6.4 oz (124.013 kg)  BMI 37.07 kg/m2  SpO2 97% General appearance: alert, cooperative, appears stated age and no distress Ears: normal TM's and external ear canals both ears Nose: Nares normal. Septum midline. Mucosa normal. No drainage or sinus tenderness. Throat: lips, mucosa, and tongue normal; teeth and gums normal Neck: no adenopathy, no carotid bruit, no JVD, supple, symmetrical, trachea midline and thyroid not enlarged, symmetric, no tenderness/mass/nodules Lungs: clear to auscultation bilaterally Heart: S1, S2 normal Extremities: extremities normal, atraumatic, no cyanosis or edema        Assessment & Plan:

## 2013-02-17 ENCOUNTER — Other Ambulatory Visit (INDEPENDENT_AMBULATORY_CARE_PROVIDER_SITE_OTHER): Payer: BC Managed Care – PPO

## 2013-02-17 LAB — HEPATIC FUNCTION PANEL
ALT: 37 U/L (ref 0–53)
AST: 19 U/L (ref 0–37)
Bilirubin, Direct: 0 mg/dL (ref 0.0–0.3)
Total Bilirubin: 0.8 mg/dL (ref 0.3–1.2)

## 2013-02-17 LAB — BASIC METABOLIC PANEL
BUN: 19 mg/dL (ref 6–23)
CO2: 30 mEq/L (ref 19–32)
Calcium: 9.5 mg/dL (ref 8.4–10.5)
Glucose, Bld: 91 mg/dL (ref 70–99)
Sodium: 141 mEq/L (ref 135–145)

## 2013-02-17 LAB — CBC WITH DIFFERENTIAL/PLATELET
Basophils Absolute: 0.1 10*3/uL (ref 0.0–0.1)
Eosinophils Absolute: 0.2 10*3/uL (ref 0.0–0.7)
Lymphocytes Relative: 43.1 % (ref 12.0–46.0)
MCHC: 35.4 g/dL (ref 30.0–36.0)
MCV: 91.8 fl (ref 78.0–100.0)
Monocytes Absolute: 0.6 10*3/uL (ref 0.1–1.0)
Neutrophils Relative %: 44.4 % (ref 43.0–77.0)
RDW: 13.2 % (ref 11.5–14.6)

## 2013-02-17 LAB — LIPID PANEL
Total CHOL/HDL Ratio: 5
Triglycerides: 80 mg/dL (ref 0.0–149.0)

## 2013-02-19 ENCOUNTER — Other Ambulatory Visit: Payer: BC Managed Care – PPO

## 2013-04-04 ENCOUNTER — Telehealth: Payer: Self-pay | Admitting: Internal Medicine

## 2013-04-04 NOTE — Telephone Encounter (Signed)
Patient called requesting rx for Belviq be sent to [facility] [address]. He states he is only in town on weekends and needs this before he leaves again on Monday.

## 2013-04-04 NOTE — Telephone Encounter (Signed)
Last filled 02/14/13 #60. Please advise      KP

## 2013-04-05 NOTE — Telephone Encounter (Signed)
Pt is pt of dr Drue Novel but I don't refill weight loss meds without ov--- pt was told this

## 2013-04-05 NOTE — Telephone Encounter (Signed)
I don't think this was supposedd to come tano me but I don't refill belviq without ov

## 2013-04-07 NOTE — Telephone Encounter (Signed)
Needs OV.  

## 2013-04-07 NOTE — Telephone Encounter (Signed)
Call 1 month supply, no RF , schedule a OV within [employer]

## 2013-04-07 NOTE — Telephone Encounter (Signed)
Pt states that unfortunately he cannot make an appt during the week due to training for work. Pt states he is out of town Mon-Fri until December. Please advise.

## 2013-04-08 MED ORDER — LORCASERIN HCL 10 MG PO TABS: 1.0000 | ORAL_TABLET | Freq: Two times a day (BID) | ORAL | Status: AC

## 2013-04-08 NOTE — Telephone Encounter (Signed)
Med placed on Paz area for signature.

## 2013-04-09 NOTE — Telephone Encounter (Signed)
Lmovm for pt to call office. Pt needs to schedule OV.

## 2014-02-20 IMAGING — CR DG ABDOMEN 1V
1 series · 2 of 2 positions shown · non-contrast
Comparison: 01/24/2013

CLINICAL DATA: Follow-up renal calculus

ABDOMEN - 1 VIEW

[Series 1: ap (kub) · U · 2 of 2 slices shown]
[im 1/2]
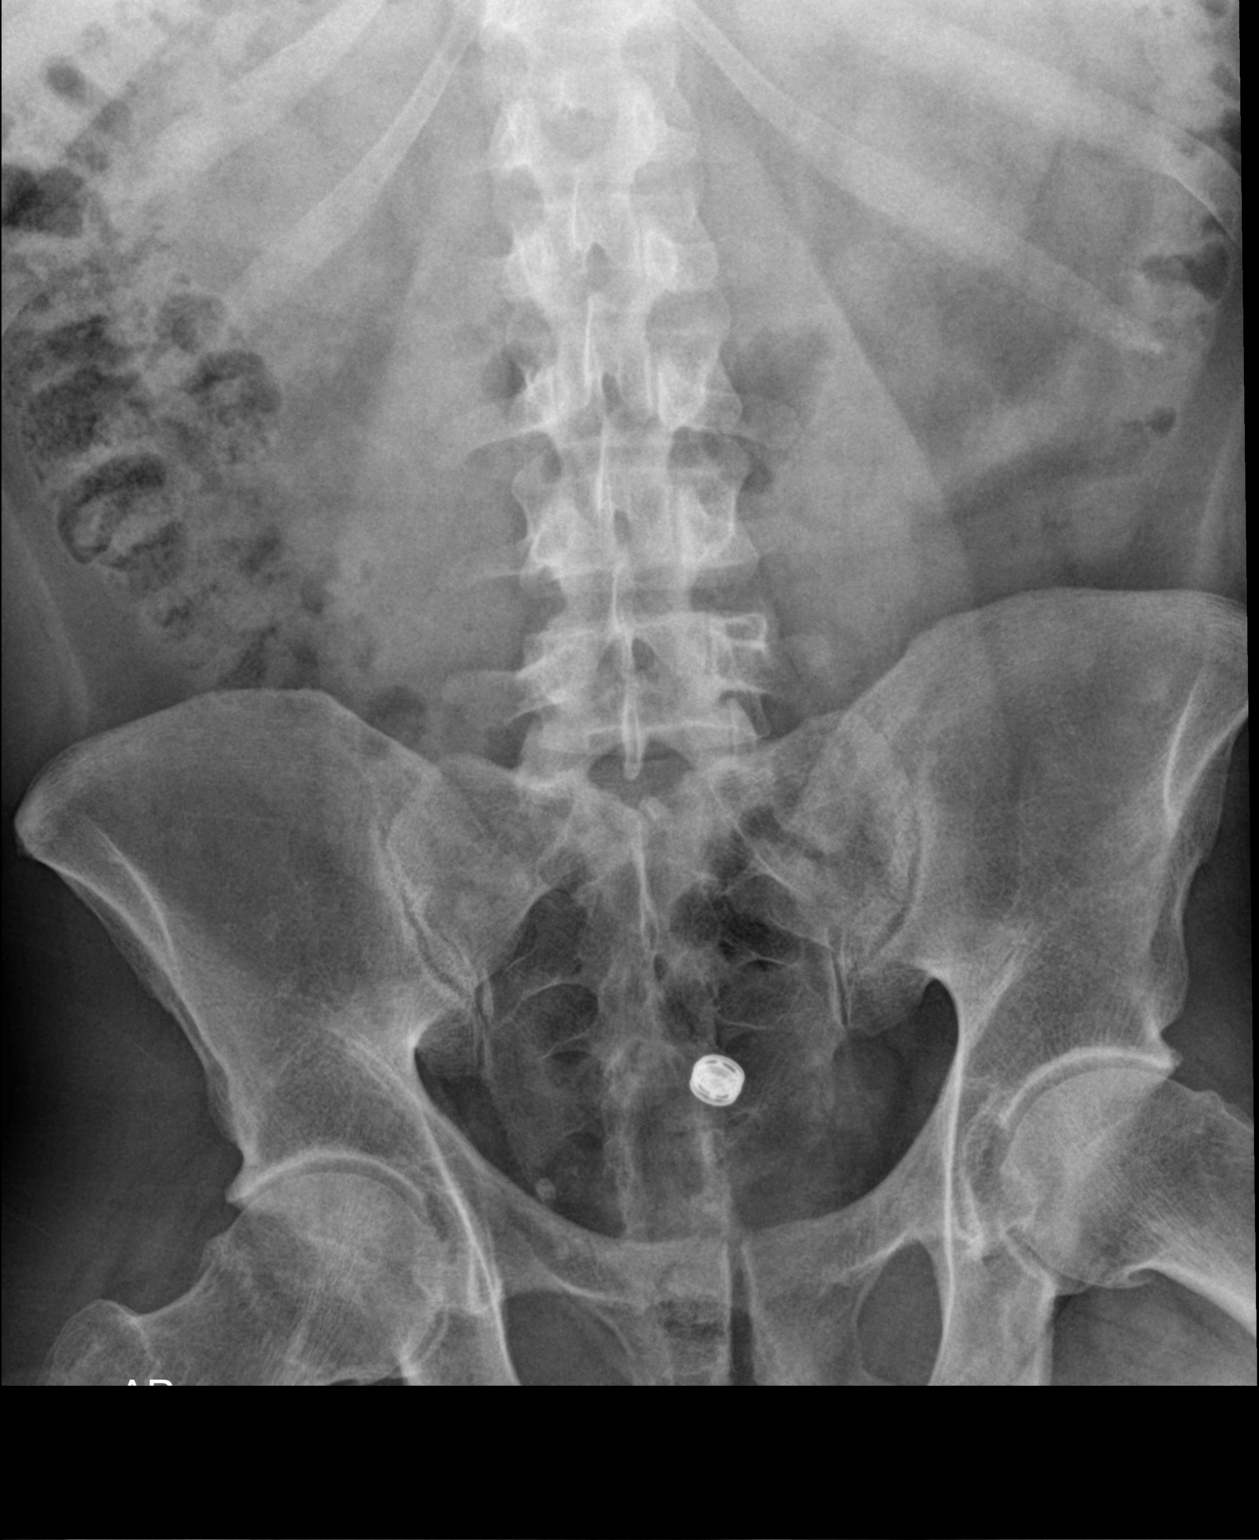
[im 2/2]
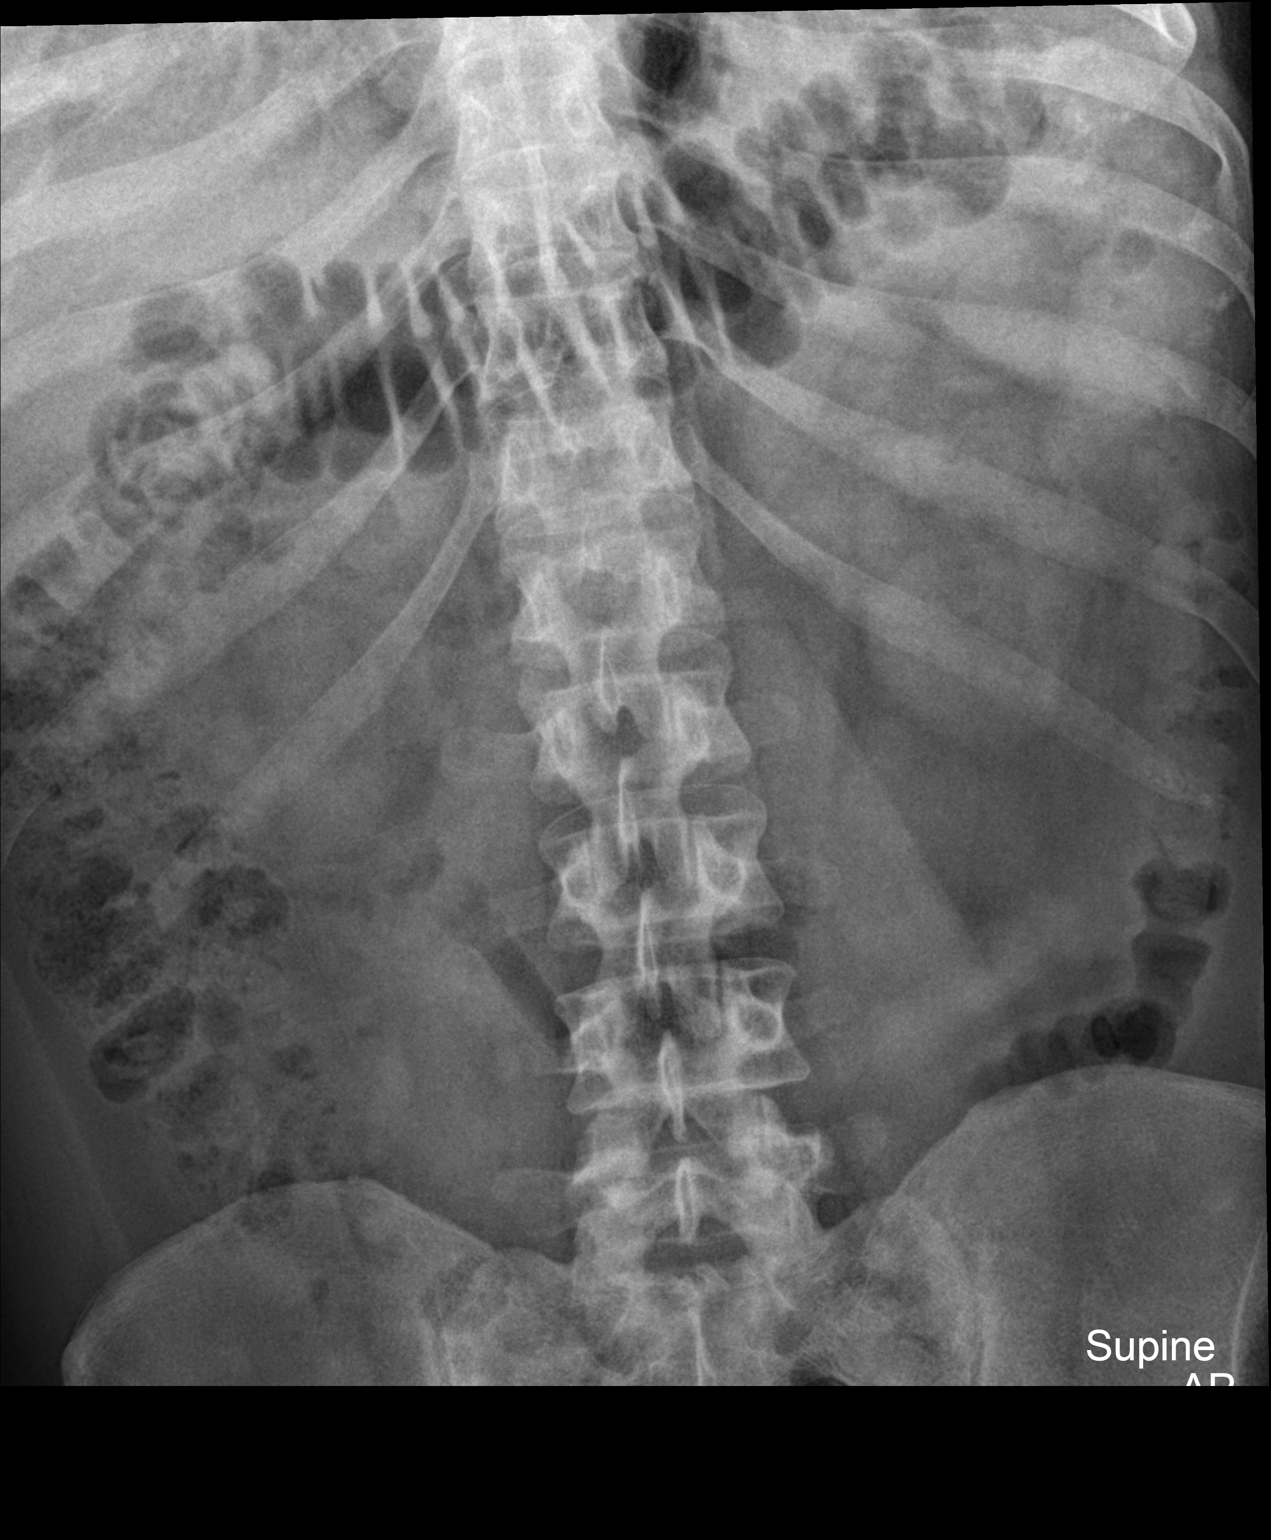

[2 of 2 positions shown; findings below may reference images not displayed]

FINDINGS: The triangular calculus projecting over the right side of
the sacrum is stable. Pelvic phleboliths are stable. No
disproportionate dilatation of bowel.  Moderate degenerative change
of the hip joints.
IMPRESSION: Stable distal right ureteral calculus.

## 2014-08-11 ENCOUNTER — Telehealth: Payer: Self-pay | Admitting: Internal Medicine

## 2014-08-11 NOTE — Telephone Encounter (Signed)
We can provide a copy of his medical records with appropriate ROI

## 2014-08-11 NOTE — Telephone Encounter (Signed)
Caller name:Blume, Haydn Relation to VF:IEPPpt:self Call back number:[phone] Pharmacy:  Reason for call: pt is in the process of getting [place] through the [place], pt has received his medical records however it did not have any of his dx that dr. Drue NovelPaz has treated him for sinus infections, upper respiratory infections, and pneumonia pt would like to know if there is a way he can get copies directly from dr. Drue NovelPaz.

## 2014-08-11 NOTE — Telephone Encounter (Signed)
Please advise.Hx of pneumonia, URIs, or sinus infections are not listed on his problem lists.

## 2014-08-11 NOTE — Telephone Encounter (Signed)
Please inform Pt, I can give him OV notes from 07/02/2012 he was seen for Chronic rhinits, 12/09/2012 for sinusitis acute and chronic and 12/11/2012, sinusitis acute and chronic. If Pt is continuing to use Dr. Drue NovelPaz as PCP he is due for appt, he has not been seen since 12/11/2012. I will placed these records in an envelope at the front desk with his name on it.

## 2014-08-12 NOTE — Telephone Encounter (Signed)
Noted  

## 2014-08-12 NOTE — Telephone Encounter (Signed)
Called pt informed him of his ov notes pt states he has found a primary doctor closer to where he lives.
